# Patient Record
Sex: Male | Born: 1995 | Race: Black or African American | Hispanic: No | Marital: Single | Smoking: Never smoker
Health system: Southern US, Community
[De-identification: ages and names within clinical notes are randomized; demographics above are authoritative.]

## PROBLEM LIST (undated history)

## (undated) HISTORY — PX: TONSILLECTOMY: SUR1361

---
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
## 2016-09-24 ENCOUNTER — Encounter: Payer: Self-pay | Admitting: Family Medicine

## 2016-09-24 ENCOUNTER — Telehealth: Payer: Self-pay | Admitting: Family Medicine

## 2016-09-24 ENCOUNTER — Ambulatory Visit (INDEPENDENT_AMBULATORY_CARE_PROVIDER_SITE_OTHER): Payer: Self-pay | Admitting: Family Medicine

## 2016-09-24 DIAGNOSIS — Z025 Encounter for examination for participation in sport: Secondary | ICD-10-CM

## 2016-09-24 NOTE — Telephone Encounter (Signed)
She can just drop the forms off and we will fill out our portion.  The patients themselves don't have to come back and there's no additional charge for us to fill those out.

## 2016-09-24 NOTE — Progress Notes (Signed)
Patient is a 20 y.o. year old male here for sports physical.  Patient plans to play basketball.  Reports no current complaints.  Denies chest pain, shortness of breath, passing out with exercise.  No medical problems.  No family history of heart disease or sudden death before age 19.   Vision 20/20 each eye without correction Blood pressure normal for age and height  No past medical history on file.  No current outpatient prescriptions on file prior to visit.   No current facility-administered medications on file prior to visit.     Past Surgical History:  Procedure Laterality Date  . TONSILLECTOMY      Allergies  Allergen Reactions  . Augmentin [Amoxicillin-Pot Clavulanate]     rash    Social History   Social History  . Marital status: Single    Spouse name: N/A  . Number of children: N/A  . Years of education: N/A   Occupational History  . Not on file.   Social History Main Topics  . Smoking status: Never Smoker  . Smokeless tobacco: Not on file  . Alcohol use No  . Drug use: Unknown  . Sexual activity: Not on file   Other Topics Concern  . Not on file   Social History Narrative  . No narrative on file    Family History  Problem Relation Age of Onset  . Sudden death Neg Hx   . Heart attack Neg Hx     BP 128/82   Pulse (!) 55   Ht 6\' 2"  (1.88 m)   Wt 170 lb 9.6 oz (77.4 kg)   BMI 21.90 kg/m   Review of Systems: See HPI above.  Physical Exam: Gen: NAD CV: RRR no MRG Lungs: CTAB MSK: FROM and strength all joints and muscle groups.  No evidence scoliosis.  Assessment/Plan: 1. Sports physical: Cleared for all sports without restrictions.

## 2016-09-24 NOTE — Assessment & Plan Note (Signed)
Cleared for all sports without restrictions. 

## 2021-02-25 ENCOUNTER — Other Ambulatory Visit: Payer: Self-pay

## 2021-02-25 ENCOUNTER — Emergency Department (HOSPITAL_BASED_OUTPATIENT_CLINIC_OR_DEPARTMENT_OTHER)
Admission: EM | Admit: 2021-02-25 | Discharge: 2021-02-26 | Disposition: A | Discharge status: Home / Self Care | Payer: BC Managed Care – PPO | Attending: Emergency Medicine | Admitting: Emergency Medicine

## 2021-02-25 ENCOUNTER — Encounter (HOSPITAL_BASED_OUTPATIENT_CLINIC_OR_DEPARTMENT_OTHER): Payer: Self-pay

## 2021-02-25 DIAGNOSIS — F29 Unspecified psychosis not due to a substance or known physiological condition: Secondary | ICD-10-CM | POA: Diagnosis not present

## 2021-02-25 DIAGNOSIS — F19959 Other psychoactive substance use, unspecified with psychoactive substance-induced psychotic disorder, unspecified: Secondary | ICD-10-CM | POA: Diagnosis present

## 2021-02-25 DIAGNOSIS — Z046 Encounter for general psychiatric examination, requested by authority: Secondary | ICD-10-CM | POA: Diagnosis present

## 2021-02-25 DIAGNOSIS — F99 Mental disorder, not otherwise specified: Secondary | ICD-10-CM

## 2021-02-25 DIAGNOSIS — F1210 Cannabis abuse, uncomplicated: Secondary | ICD-10-CM | POA: Diagnosis not present

## 2021-02-25 DIAGNOSIS — Z20822 Contact with and (suspected) exposure to covid-19: Secondary | ICD-10-CM | POA: Insufficient documentation

## 2021-02-25 LAB — COMPREHENSIVE METABOLIC PANEL
ALT: 11 U/L (ref 0–44)
AST: 15 U/L (ref 15–41)
Albumin: 4.8 g/dL (ref 3.5–5.0)
Alkaline Phosphatase: 58 U/L (ref 38–126)
Anion gap: 9 (ref 5–15)
BUN: 18 mg/dL (ref 6–20)
CO2: 27 mmol/L (ref 22–32)
Calcium: 9.1 mg/dL (ref 8.9–10.3)
Chloride: 101 mmol/L (ref 98–111)
Creatinine, Ser: 1.27 mg/dL — ABNORMAL HIGH (ref 0.61–1.24)
GFR, Estimated: >60 mL/min (ref >60)
Glucose, Bld: 84 mg/dL (ref 70–99)
Potassium: 3.7 mmol/L (ref 3.5–5.1)
Sodium: 137 mmol/L (ref 135–145)
Total Bilirubin: 0.6 mg/dL (ref 0.3–1.2)
Total Protein: 7.6 g/dL (ref 6.5–8.1)

## 2021-02-25 LAB — CBC WITH DIFFERENTIAL/PLATELET
Abs Immature Granulocytes: 0 10*3/uL (ref 0.00–0.07)
Basophils Absolute: 0 10*3/uL (ref 0.0–0.1)
Basophils Relative: 0 %
Eosinophils Absolute: 0 10*3/uL (ref 0.0–0.5)
Eosinophils Relative: 0 %
HCT: 42.6 % (ref 39.0–52.0)
Hemoglobin: 14.2 g/dL (ref 13.0–17.0)
Immature Granulocytes: 0 %
Lymphocytes Relative: 41 %
Lymphs Abs: 2.4 10*3/uL (ref 0.7–4.0)
MCH: 30.8 pg (ref 26.0–34.0)
MCHC: 33.3 g/dL (ref 30.0–36.0)
MCV: 92.4 fL (ref 80.0–100.0)
Monocytes Absolute: 0.4 10*3/uL (ref 0.1–1.0)
Monocytes Relative: 8 %
Neutro Abs: 3 10*3/uL (ref 1.7–7.7)
Neutrophils Relative %: 51 %
Platelets: 158 10*3/uL (ref 150–400)
RBC: 4.61 MIL/uL (ref 4.22–5.81)
RDW: 12.4 % (ref 11.5–15.5)
WBC: 5.8 10*3/uL (ref 4.0–10.5)
nRBC: 0 % (ref 0.0–0.2)

## 2021-02-25 LAB — RAPID URINE DRUG SCREEN, HOSP PERFORMED
Amphetamines: NOT DETECTED
Barbiturates: NOT DETECTED
Benzodiazepines: NOT DETECTED
Cocaine: NOT DETECTED
Opiates: NOT DETECTED
Tetrahydrocannabinol: POSITIVE — AB

## 2021-02-25 LAB — RESP PANEL BY RT-PCR (FLU A&B, COVID) ARPGX2
Influenza A by PCR: NEGATIVE
Influenza B by PCR: NEGATIVE
SARS Coronavirus 2 by RT PCR: NEGATIVE

## 2021-02-25 LAB — ETHANOL: Alcohol, Ethyl (B): <10 mg/dL (ref <10)

## 2021-02-25 NOTE — ED Triage Notes (Signed)
Pt presents with HPPD for IVC by family. Pt reports to me that he is here for a "check up". Denies SI/HI.

## 2021-02-25 NOTE — ED Provider Notes (Signed)
[facility] EMERGENCY DEPARTMENT Provider Note   CSN: [id number] Arrival date & time: 02/25/21  1621     History Chief Complaint  Patient presents with  . Psychiatric Evaluation    Ethan Oconnor is a 25 y.o. male with a history of psychosis, cannabis use disorder, presenting under IVC by family for bizarre behavior.  The patient simply reports that he feels fine, has no complaints.  He denies suicidal ideation wanting to harm himself or others.  He denies that he is hearing voices.  He denies seeing anything.  He does not know why he is here.    Supplemental history provided by his mother on the phone.  She took out the IVC.   His mother reports that the patient was committed with a psychotic episode in December 2020.  He was discharged at time of multiple psych medications.  He stopped taking them in March 2021, except for Invega injections, which were scheduled by his psychiatrist.  His last injection was in November December of last year, but then he announced that he no longer wanted them or needed them.  He is therefore been off all of his psychiatric medications.  His mother is reported she has noted a consistent change in his behavior.  She feels he may be erratic and more aggressive at times.  She does report the patient has been going to school, and going to work regularly.  His behavior did abruptly change over this weekend.  She notes that he is smoking marijuana regularly.  She states that at 4 AM this morning, the patient came into the room, wanting to talk to her and her husband about God.  The patient also has to sleep in the bed with him.  He says the patient has a flat affect, simply staring ahead, slow to respond.  This is an abrupt change in his behavior.   HPI     History reviewed. No pertinent past medical history.  Patient Active Problem List   Diagnosis Date Noted  . Sports physical 09/24/2016    Past Surgical History:  Procedure Laterality Date  .  TONSILLECTOMY         Family History  Problem Relation Age of Onset  . Sudden death Neg Hx   . Heart attack Neg Hx     Social History   Tobacco Use  . Smoking status: Never Smoker  . Smokeless tobacco: Never Used  Substance Use Topics  . Alcohol use: Yes  . Drug use: Yes    Types: Marijuana    Home Medications Prior to Admission medications   Medication Sig Start Date End Date Taking? Authorizing Provider  sertraline (ZOLOFT) 100 MG tablet Take by mouth. 11/03/19  Yes [provider]  divalproex (DEPAKOTE ER) 500 MG 24 hr tablet Take 1,000 mg by mouth at bedtime. 09/05/20   [provider]  INVEGA SUSTENNA 156 MG/ML SUSY injection Inject into the muscle. 09/27/20   [provider]    Allergies    Augmentin [amoxicillin-pot clavulanate]  Review of Systems   Review of Systems  Constitutional: Negative for chills and fever.  HENT: Negative for ear pain and sore throat.   Eyes: Negative for pain and visual disturbance.  Respiratory: Negative for cough and shortness of breath.   Cardiovascular: Negative for chest pain and palpitations.  Gastrointestinal: Negative for abdominal pain and vomiting.  Genitourinary: Negative for dysuria and hematuria.  Musculoskeletal: Negative for arthralgias and back pain.  Skin: Negative for color  change and rash.  Neurological: Negative for syncope, light-headedness and headaches.  All other systems reviewed and are negative.   Physical Exam Updated Vital Signs BP (!) 146/97 (BP Location: Right Arm)   Pulse 91   Temp 98.3 F (36.8 C) (Oral)   Resp 18   Ht 6\' 4"  (1.93 m)   Wt 79.2 kg   SpO2 99%   BMI 21.25 kg/m   Physical Exam Constitutional:      General: He is not in acute distress.    Comments: Flat affect, slow to respond  HENT:     Head: Normocephalic and atraumatic.  Eyes:     Conjunctiva/sclera: Conjunctivae normal.     Pupils: Pupils are equal, round, and reactive to light.   Cardiovascular:     Rate and Rhythm: Normal rate and regular rhythm.  Pulmonary:     Effort: Pulmonary effort is normal. No respiratory distress.  Abdominal:     General: There is no distension.     Tenderness: There is no abdominal tenderness.  Skin:    General: Skin is warm and dry.  Neurological:     General: No focal deficit present.     Mental Status: He is alert and oriented to person, place, and time. Mental status is at baseline.     ED Results / Procedures / Treatments   Labs (all labs ordered are listed, but only abnormal results are displayed) Labs Reviewed  COMPREHENSIVE METABOLIC PANEL - Abnormal; Notable for the following components:      Result Value   Creatinine, Ser 1.27 (*)    All other components within normal limits  RAPID URINE DRUG SCREEN, HOSP PERFORMED - Abnormal; Notable for the following components:   Tetrahydrocannabinol POSITIVE (*)    All other components within normal limits  RESP PANEL BY RT-PCR (FLU A&B, COVID) ARPGX2  ETHANOL  CBC WITH DIFFERENTIAL/PLATELET    EKG EKG Interpretation  Date/Time:  Monday February 25 2021 17:44:41 EDT Ventricular Rate:  78 PR Interval:  144 QRS Duration: 86 QT Interval:  382 QTC Calculation: 435 R Axis:   93 Text Interpretation: Normal sinus rhythm Rightward axis Nonspecific ST and T wave abnormality Abnormal ECG Likely benign early repolarization pattern No STEMI Confirmed by 04-25-2002 [phone]) on 02/25/2021 5:50:32 PM   Radiology No results found.  Procedures Procedures   Medications Ordered in ED Medications - No data to display  ED Course  I have reviewed the triage vital signs and the nursing notes.  Pertinent labs & imaging results that were available during my care of the patient were reviewed by me and considered in my medical decision making (see chart for details).  This patient presents to the Emergency Department with complaint of psychiatric disturbance. This involves an extensive  number of treatment options, and is a complaint that carries with it a high risk of complications and morbidity.   I ordered, reviewed, and interpreted labs, including BMP and CBC.  There were no immediate, life-threatening emergencies found in this labwork.  The patient was medically cleared for TTS and psychiatric evaluation.  At this time, the patient is under IVC (filed by parents, upheld by myself on 02/25/21)  Additional history was obtained from patient's mother Previous records obtained and reviewed showing psychiatric episode 10/2019  I personally reviewed the patients ECG which showed sinus rhythm with no acute ischemic findings  *  Labs reviewed & Medically cleared for TTS evaluation    Final Clinical Impression(s) / ED Diagnoses Final  diagnoses:  Psychiatric disturbance  Cannabis abuse    Rx / DC Orders ED Discharge Orders    None       Lakethia Coppess, Kermit Balo, MD 02/25/21 [phone]

## 2021-02-25 NOTE — ED Notes (Signed)
Called staffing  No sitter available

## 2021-02-26 ENCOUNTER — Ambulatory Visit (HOSPITAL_COMMUNITY)
Admission: EM | Admit: 2021-02-26 | Discharge: 2021-02-27 | Disposition: A | Discharge status: Home / Self Care | Payer: BC Managed Care – PPO | Attending: Nurse Practitioner | Admitting: Nurse Practitioner

## 2021-02-26 ENCOUNTER — Other Ambulatory Visit: Payer: Self-pay

## 2021-02-26 ENCOUNTER — Encounter (HOSPITAL_COMMUNITY): Payer: Self-pay | Admitting: Registered Nurse

## 2021-02-26 ENCOUNTER — Encounter (HOSPITAL_BASED_OUTPATIENT_CLINIC_OR_DEPARTMENT_OTHER): Payer: Self-pay | Admitting: Registered Nurse

## 2021-02-26 DIAGNOSIS — F12959 Cannabis use, unspecified with psychotic disorder, unspecified: Secondary | ICD-10-CM | POA: Diagnosis not present

## 2021-02-26 DIAGNOSIS — F1210 Cannabis abuse, uncomplicated: Secondary | ICD-10-CM | POA: Diagnosis not present

## 2021-02-26 DIAGNOSIS — F19959 Other psychoactive substance use, unspecified with psychoactive substance-induced psychotic disorder, unspecified: Secondary | ICD-10-CM | POA: Diagnosis not present

## 2021-02-26 DIAGNOSIS — F99 Mental disorder, not otherwise specified: Secondary | ICD-10-CM

## 2021-02-26 LAB — LIPID PANEL
Cholesterol: 152 mg/dL (ref 0–200)
HDL: 41 mg/dL (ref >40)
LDL Cholesterol: 103 mg/dL — ABNORMAL HIGH (ref 0–99)
Total CHOL/HDL Ratio: 3.7 RATIO
Triglycerides: 41 mg/dL (ref <150)
VLDL: 8 mg/dL (ref 0–40)

## 2021-02-26 LAB — URINALYSIS, ROUTINE W REFLEX MICROSCOPIC
Glucose, UA: NEGATIVE mg/dL
Hgb urine dipstick: NEGATIVE
Ketones, ur: 40 mg/dL — AB
Leukocytes,Ua: NEGATIVE
Nitrite: NEGATIVE
Protein, ur: NEGATIVE mg/dL
Specific Gravity, Urine: 1.025 (ref 1.005–1.030)
pH: 6 (ref 5.0–8.0)

## 2021-02-26 LAB — TSH: TSH: 1.436 u[IU]/mL (ref 0.350–4.500)

## 2021-02-26 MED ORDER — ACETAMINOPHEN 325 MG PO TABS: 650.0000 mg | ORAL_TABLET | Freq: Four times a day (QID) | ORAL | Status: DC | PRN

## 2021-02-26 MED ORDER — HYDROXYZINE HCL 25 MG PO TABS: 25.0000 mg | ORAL_TABLET | Freq: Three times a day (TID) | ORAL | Status: DC | PRN

## 2021-02-26 MED ORDER — TRAZODONE HCL 50 MG PO TABS
50.0000 mg | ORAL_TABLET | Freq: Every evening | ORAL | Status: DC | PRN
  Administered 2021-02-26: 50 mg via ORAL
  Filled 2021-02-26: qty 1

## 2021-02-26 MED ORDER — MAGNESIUM HYDROXIDE 400 MG/5ML PO SUSP: 30.0000 mL | Freq: Every day | ORAL | Status: DC | PRN

## 2021-02-26 MED ORDER — OLANZAPINE 5 MG PO TABS
5.0000 mg | ORAL_TABLET | Freq: Every day | ORAL | Status: DC
  Administered 2021-02-26: 5 mg via ORAL
  Filled 2021-02-26: qty 1

## 2021-02-26 MED ORDER — ALUM & MAG HYDROXIDE-SIMETH 200-200-20 MG/5ML PO SUSP: 30.0000 mL | ORAL | Status: DC | PRN

## 2021-02-26 MED ORDER — OLANZAPINE 5 MG PO TBDP: 5.0000 mg | ORAL_TABLET | Freq: Every day | ORAL | Status: DC

## 2021-02-26 NOTE — ED Notes (Signed)
Pt on screen with TTS.
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
## 2021-02-26 NOTE — Discharge Instructions (Addendum)
Dear Ethan Oconnor,  You presented here after heavy marijuana use and were acting paranoid and was responding to voices.  He received some antipsychotic in the unit. On reevaluation you were doing great and was not seem like psychotic anymore.  You were counseled repeatedly about cessation of marijuana smoking marijuana leading to your second hospitalization.   You promised decreased intake of marijuana and then slowly stop smoking marijuana and also get outpatient help with psychiatry and therapy. I hope you do well and it was pleasure taking care of you! Dr. Gerarda Fraction

## 2021-02-26 NOTE — ED Notes (Signed)
TTS taken to the room.

## 2021-02-26 NOTE — ED Provider Notes (Signed)
Behavioral Health Admission [facility]&P [facility](FBC & OBS)  Date: 02/26/21 Patient Name: Ethan HongDerek Rudzinski MRN: [id number] Chief Complaint: No chief complaint on file.     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  Patient seen by this provider earlier today via tele health.   HPI per Tele health Assessment by this writer 02/26/21:   HPI:  Ethan Oconnor, 25 y.o., male patient seen via tele health by this provider, consulted with Dr. Nelly RoutArchana Kumar; and chart reviewed on 02/26/21.  On evaluation Ethan HongDerek Deanda reports he was brought to the hospital to have a mental evaluation "I need to check my mental health trying to figure everything out."  Patient reporting he was having paranoia earlier today but states he is feeling better now.  Patient states he has diagnosis of schizophrenia and has outpatient psychiatric services with Dr. Jannifer FranklinAkintayo.  Patient reports that he stopped taking his medication about 3 or 4 months ago because he felt he did not need them any longer.  Patient also reports that he smokes marijuana on a daily basis.  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  Patient states he is doing well in all of his classes except for one and that his grade in that class is a D.  Patient states he lives with his parents.  Patient denies suicidal/homicidal ideation, and auditory/visual hallucinations.  Patient reports after stopping his medications he continued to do well until about 2-3 weeks ago.   During evaluation Ethan HongDerek Stroot is sitting up in bed in no acute distress.  He is alert, oriented x 4, calm and cooperative throughout assessment.  His mood is euthymic  but guarded with congruent affect.  He does not appear to be responding to internal/external stimuli or delusional thoughts; but he is guarded and at first denied his drug use until his mother spoke up and then gave truthful answers to questions.  Patient still feels that he doesn't need psychotropic medications.  Patient denies suicidal/self-harm/homicidal ideation, and psychosis.  Patient states he continues to be a little paranoid but it's better.  Patient gave permission to speak to his mother who is at bed side.    Collateral Information:  Spoke to patient mother Juleen Starr(Nicole Poer) at patients bedside Mother states that patient stopped his medications around Dec 2021 and completed his therapy around Jan 2022.  States that he smokes marijuana on a daily basis and sometimes up to twice a day.  Sates that she has notice that he may be smoking more than he usually does.  "We've tried everything to get him to stop.  I don't condone it and we have rules that he doesn't smoke in our house or around us but yesterday I was on a business call.  Me and his father work from home and I was in my home office on a business conference and he walked in my office sat down and lit a blunt in front of me.  He was blowing the smoke towards me.  I tried several times to get him to stop and I final got through the conference call and asked what he thought he was doing but he could speak.  He was pacing back and forth."  Mother states over the last several day she has notice odd behavior, random conversations out of the blue, and isolation.  "I've noticed this once before back around Feb or March 2021 when he has stopped his medications before but we were able to get him back on his medications be for it worsened.  By the end of March he was back on his medications and improved.  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     Patient seen face to face by this provider, consulted with Dr. Bronwen Betters; and chart reviewed on 02/26/21.  On evaluation there is no change in Ethan Oconnor complaints.  Patient appears to be doing better but continues to be guarded with paranoia.  Patient admitted to Continuous Assessment Unit for safety and stabilization  PHQ 2-9:  Flowsheet Row ED from 02/25/2021 in [facility] [place] [facility]  Thoughts that you would be better off dead, or of hurting yourself in some way Not at all  PHQ-9 Total Score 1      Flowsheet Row ED from 02/26/2021 in [facility] ED from 02/25/2021 in [facility] [facility]  C-SSRS RISK CATEGORY No Risk No Risk       Total Time spent with patient: 30 minutes  Musculoskeletal  Strength & Muscle Tone: within normal limits Gait & Station: normal Patient leans: N/A  Psychiatric Specialty Exam  Presentation General Appearance: Appropriate for Environment; Casual  Eye Contact:Good  Speech:Clear and Coherent; Normal Rate  Speech Volume:Normal  Handedness:Right   Mood and Affect  Mood:Anxious  Affect:Congruent   Thought Process  Thought Processes:Coherent; Goal Directed  Descriptions of Associations:Intact  Orientation:Full (Time, Place and Person)  Thought Content:Paranoid Ideation  Diagnosis of Schizophrenia or Schizoaffective disorder in past: Yes  Duration of Psychotic Symptoms: Less than six months  Hallucinations:Hallucinations: None  Ideas of Reference:None  Suicidal Thoughts:Suicidal Thoughts: No  Homicidal Thoughts:Homicidal Thoughts: No   Sensorium  Memory:Immediate Fair; Recent Fair  Judgment:Fair  Insight:Present   Executive Functions  Concentration:Fair  Attention  Span:Fair  Recall:Fair  Fund of Knowledge:Good  Language:Good   Psychomotor Activity  Psychomotor Activity:Psychomotor Activity: Normal   Assets  Assets:Communication Skills; Desire for Improvement; Housing; Resilience; Social Support   Sleep  Sleep:Sleep: Fair   Nutritional Assessment (For OBS and FBC admissions only) Has the patient had a weight loss or gain of 10 pounds or more in the last 3 months?: No Has the patient had a decrease in food intake/or appetite?: No Does the patient have dental problems?: No Does the patient have eating habits or behaviors that may be indicators of an eating disorder including binging or inducing vomiting?: No Has the patient recently lost weight without trying?: No Has the patient been eating poorly because of a decreased appetite?: No Malnutrition Screening Tool Score: 0    Physical Exam ROS  Blood pressure (!) 154/96, pulse 68, temperature 98.1 F (36.7 C), temperature source Oral, resp. rate 16, SpO2 100 %. There is no height or weight on file to calculate BMI.  Past Psychiatric History: Brief psychotic disorder, Schizophrenia, substance induced psychosis, and Cannabis use disorder   Is the patient at  risk to self? No  Has the patient been a risk to self in the past 6 months? No .    Has the patient been a risk to self within the distant past? No   Is the patient a risk to others? No   Has the patient been a risk to others in the past 6 months? No   Has the patient been a risk to others within the distant past? No   Past Medical History: History reviewed. No pertinent past medical history.  Past Surgical History:  Procedure Laterality Date  . TONSILLECTOMY      Family History:  Family History  Problem Relation Age of Onset  . Sudden death Neg Hx   . Heart attack Neg Hx     Social History:  Social History   Socioeconomic History  . Marital status: Single    Spouse name: Not on file  . Number of children: Not on  file  . Years of education: Not on file  . Highest education level: Not on file  Occupational History  . Not on file  Tobacco Use  . Smoking status: Never Smoker  . Smokeless tobacco: Never Used  Substance and Sexual Activity  . Alcohol use: Yes  . Drug use: Yes    Types: Marijuana  . Sexual activity: Not on file  Other Topics Concern  . Not on file  Social History Narrative  . Not on file   Social Determinants of Health   Financial Resource Strain: Not on file  Food Insecurity: Not on file  Transportation Needs: Not on file  Physical Activity: Not on file  Stress: Not on file  Social Connections: Not on file  Intimate Partner Violence: Not on file    SDOH:  SDOH Screenings   Alcohol Screen: Not on file  Depression (PHQ2-9): Low Risk   . PHQ-2 Score: 1  Financial Resource Strain: Not on file  Food Insecurity: Not on file  Housing: Not on file  Physical Activity: Not on file  Social Connections: Not on file  Stress: Not on file  Tobacco Use: Low Risk   . Smoking Tobacco Use: Never Smoker  . Smokeless Tobacco Use: Never Used  Transportation Needs: Not on file    Last Labs:  Admission on 02/25/2021, Discharged on 02/26/2021  Component Date Value Ref Range Status  . SARS Coronavirus 2 by RT PCR 02/25/2021 NEGATIVE  NEGATIVE Final   Comment: (NOTE) SARS-CoV-2 target nucleic acids are NOT DETECTED.  The SARS-CoV-2 RNA is generally detectable in upper respiratory specimens during the acute phase of infection. The lowest concentration of SARS-CoV-2 viral copies this assay can detect is 138 copies/mL. A negative result does not preclude SARS-Cov-2 infection and should not be used as the sole basis for treatment or other patient management decisions. A negative result may occur with  improper specimen collection/handling, submission of specimen other than nasopharyngeal swab, presence of viral mutation(s) within the areas targeted by this assay, and inadequate  number of viral copies(<138 copies/mL). A negative result must be combined with clinical observations, patient history, and epidemiological information. The expected result is Negative.  Fact Sheet for Patients:  BloggerCourse.com  Fact Sheet for Healthcare Providers:  SeriousBroker.it  This test is no                          t yet approved or cleared by the [place] FDA and  has been authorized for detection and/or  diagnosis of SARS-CoV-2 by FDA under an Emergency Use Authorization (EUA). This EUA will remain  in effect (meaning this test can be used) for the duration of the COVID-19 declaration under Section 564(b)(1) of the Act, 21 U.S.C.section 360bbb-3(b)(1), unless the authorization is terminated  or revoked sooner.      . Influenza A by PCR 02/25/2021 NEGATIVE  NEGATIVE Final  . Influenza B by PCR 02/25/2021 NEGATIVE  NEGATIVE Final   Comment: (NOTE) The Xpert Xpress SARS-CoV-2/FLU/RSV plus assay is intended as an aid in the diagnosis of influenza from Nasopharyngeal swab specimens and should not be used as a sole basis for treatment. Nasal washings and aspirates are unacceptable for Xpert Xpress SARS-CoV-2/FLU/RSV testing.  Fact Sheet for Patients: BloggerCourse.com  Fact Sheet for Healthcare Providers: SeriousBroker.it  This test is not yet approved or cleared by the [place] FDA and has been authorized for detection and/or diagnosis of SARS-CoV-2 by FDA under an Emergency Use Authorization (EUA). This EUA will remain in effect (meaning this test can be used) for the duration of the COVID-19 declaration under Section 564(b)(1) of the Act, 21 U.S.C. section 360bbb-3(b)(1), unless the authorization is terminated or revoked.  Performed at [facility], [address] [place], [place] [address]   . Sodium 02/25/2021 137  135 - 145 mmol/L Final   . Potassium 02/25/2021 3.7  3.5 - 5.1 mmol/L Final  . Chloride 02/25/2021 101  98 - 111 mmol/L Final  . CO2 02/25/2021 27  22 - 32 mmol/L Final  . Glucose, Bld 02/25/2021 84  70 - 99 mg/dL Final   Glucose reference range applies only to samples taken after fasting for at least 8 hours.  . BUN 02/25/2021 18  6 - 20 mg/dL Final  . Creatinine, Ser 02/25/2021 1.27* 0.61 - 1.24 mg/dL Final  . Calcium 78/58/8502 9.1  8.9 - 10.3 mg/dL Final  . Total Protein 02/25/2021 7.6  6.5 - 8.1 g/dL Final  . Albumin 77/41/2878 4.8  3.5 - 5.0 g/dL Final  . AST 67/67/2094 15  15 - 41 U/L Final  . ALT 02/25/2021 11  0 - 44 U/L Final  . Alkaline Phosphatase 02/25/2021 58  38 - 126 U/L Final  . Total Bilirubin 02/25/2021 0.6  0.3 - 1.2 mg/dL Final  . GFR, Estimated 02/25/2021 >60  >60 mL/min Final   Comment: (NOTE) Calculated using the CKD-EPI Creatinine Equation (2021)   . Anion gap 02/25/2021 9  5 - 15 Final   Performed at [facility], [address] [place], [place] [address]  . Alcohol, Ethyl (B) 02/25/2021 <10  <10 mg/dL Final   Comment: (NOTE) Lowest detectable limit for serum alcohol is 10 mg/dL.  For medical purposes only. Performed at [facility], [address] [place], [place] [address]   . Opiates 02/25/2021 NONE DETECTED  NONE DETECTED Final  . Cocaine 02/25/2021 NONE DETECTED  NONE DETECTED Final  . Benzodiazepines 02/25/2021 NONE DETECTED  NONE DETECTED Final  . Amphetamines 02/25/2021 NONE DETECTED  NONE DETECTED Final  . Tetrahydrocannabinol 02/25/2021 POSITIVE* NONE DETECTED Final  . Barbiturates 02/25/2021 NONE DETECTED  NONE DETECTED Final   Comment: (NOTE) DRUG SCREEN FOR MEDICAL PURPOSES ONLY.  IF CONFIRMATION IS NEEDED FOR ANY PURPOSE, NOTIFY LAB WITHIN 5 DAYS.  LOWEST DETECTABLE LIMITS FOR URINE DRUG SCREEN Drug Class                     Cutoff (ng/mL)  Amphetamine and metabolites    1000 Barbiturate and metabolites    200 Benzodiazepine                  200 Tricyclics and metabolites     300 Opiates and metabolites        300 Cocaine and metabolites        300 THC                            50 Performed at [facility], [address] [place], [place] [address]   . WBC 02/25/2021 5.8  4.0 - 10.5 K/uL Final  . RBC 02/25/2021 4.61  4.22 - 5.81 MIL/uL Final  . Hemoglobin 02/25/2021 14.2  13.0 - 17.0 g/dL Final  . HCT 56/43/3295 42.6  39.0 - 52.0 % Final  . MCV 02/25/2021 92.4  80.0 - 100.0 fL Final  . MCH 02/25/2021 30.8  26.0 - 34.0 pg Final  . MCHC 02/25/2021 33.3  30.0 - 36.0 g/dL Final  . RDW 18/84/1660 12.4  11.5 - 15.5 % Final  . Platelets 02/25/2021 158  150 - 400 K/uL Final  . nRBC 02/25/2021 0.0  0.0 - 0.2 % Final  . Neutrophils Relative % 02/25/2021 51  % Final  . Neutro Abs 02/25/2021 3.0  1.7 - 7.7 K/uL Final  . Lymphocytes Relative 02/25/2021 41  % Final  . Lymphs Abs 02/25/2021 2.4  0.7 - 4.0 K/uL Final  . Monocytes Relative 02/25/2021 8  % Final  . Monocytes Absolute 02/25/2021 0.4  0.1 - 1.0 K/uL Final  . Eosinophils Relative 02/25/2021 0  % Final  . Eosinophils Absolute 02/25/2021 0.0  0.0 - 0.5 K/uL Final  . Basophils Relative 02/25/2021 0  % Final  . Basophils Absolute 02/25/2021 0.0  0.0 - 0.1 K/uL Final  . Immature Granulocytes 02/25/2021 0  % Final  . Abs Immature Granulocytes 02/25/2021 0.00  0.00 - 0.07 K/uL Final   Performed at [facility], [address] [place], [place] [address]  . Color, Urine 02/25/2021 YELLOW  YELLOW Final  . APPearance 02/25/2021 CLOUDY* CLEAR Final  . Specific Gravity, Urine 02/25/2021 1.025  1.005 - 1.030 Final  . pH 02/25/2021 6.0  5.0 - 8.0 Final  . Glucose, UA 02/25/2021 NEGATIVE  NEGATIVE mg/dL Final  . Hgb urine dipstick 02/25/2021 NEGATIVE  NEGATIVE Final  . Bilirubin Urine 02/25/2021 SMALL* NEGATIVE Final  . Ketones, ur 02/25/2021 40* NEGATIVE mg/dL Final  . Protein, ur 11/25/3233 NEGATIVE  NEGATIVE mg/dL Final  . Nitrite 57/32/2025  NEGATIVE  NEGATIVE Final  . Glori Luis 02/25/2021 NEGATIVE  NEGATIVE Final   Comment: Microscopic not done on urines with negative protein, blood, leukocytes, nitrite, or glucose < 500 mg/dL. Performed at [facility], [address] [place], [place] [address]   . TSH 02/26/2021 1.436  0.350 - 4.500 uIU/mL Final   Comment: Performed by a 3rd Generation assay with a functional sensitivity of <=0.01 uIU/mL. Performed at [facility], [address] [place], [place] [address]   . Cholesterol 02/26/2021 152  0 - 200 mg/dL Final  . Triglycerides 02/26/2021 41  <150 mg/dL Final  . HDL 83/15/1761 41  >40 mg/dL Final  . Total CHOL/HDL Ratio 02/26/2021 3.7  RATIO Final  . VLDL 02/26/2021 8  0 - 40 mg/dL Final  . LDL Cholesterol 02/26/2021 103* 0 - 99 mg/dL Final  Comment:        Total Cholesterol/HDL:CHD Risk Coronary Heart Disease Risk Table                     Men   Women  1/2 Average Risk   3.4   3.3  Average Risk       5.0   4.4  2 X Average Risk   9.6   7.1  3 X Average Risk  23.4   11.0        Use the calculated Patient Ratio above and the CHD Risk Table to determine the patient's CHD Risk.        ATP III CLASSIFICATION (LDL):  <100     mg/dL   Optimal  161-096  mg/dL   Near or Above                    Optimal  130-159  mg/dL   Borderline  045-409  mg/dL   High  >811     mg/dL   Very High Performed at [facility], [address], [place], [place] [address]     Allergies: Augmentin [amoxicillin-pot clavulanate]  PTA Medications: (Not in a hospital admission)   Medical Decision Making  Patient admitted to Continuous Assessment Unit for safety and stabilization  Labs reviewed:  See values above  Medication Management: Started Zyprexa Zydis 5 mg Q hs  Recommendations  Based on my evaluation the patient does not appear to have an emergency medical condition.  Irina Okelly, NP 02/26/21  6:46 PM

## 2021-02-26 NOTE — ED Notes (Signed)
Pt belongings in locker #17. °

## 2021-02-26 NOTE — ED Notes (Signed)
Pt asleep in bed. Respirations even and unlabored. Will continue to monitor for safety. ?

## 2021-02-26 NOTE — ED Notes (Signed)
Pt A& O x 4, ambulatory on unit, no distress noted.  Calm & guarded at present.  Monitoring for safety.

## 2021-02-26 NOTE — BH Assessment (Addendum)
Comprehensive Clinical Assessment (CCA) Note  02/26/2021 Ethan Oconnor [id number]   Recommendations for Services/Supports/Treatments: Nira Conn, NP, reviewed pt's chart and information and determined pt should be observed overnight at [facility] for safety and stability and re-assessed in the morning by psychiatry. This information was provided to pt's nurse, Shaun RN, at [phone].   The patient demonstrates the following risk factors for suicide: Chronic risk factors for suicide include: psychiatric disorder of psychosis and anxiety and demographic factors (male, >86 y/o). Acute risk factors for suicide include: social withdrawal/isolation. Protective factors for this patient include: positive social support and hope for the future. Considering these factors, the overall suicide risk at this point appears to be none. Patient is appropriate for outpatient follow up.  Therefore, no sitter is recommended for suicide precaution.  Flowsheet Row ED from 02/25/2021 in [facility] EMERGENCY DEPARTMENT  C-SSRS RISK CATEGORY No Risk      Chief Complaint:  Chief Complaint  Patient presents with  . Psychiatric Evaluation   Visit Diagnosis: F29, Unspecified schizophrenia spectrum and other psychotic disorder  CCA Screening, Triage and Referral (STR) Kyro Joswick is a 25 year old patient who was involuntarily brought to [facility] ED under IVC that was completed by his mother. The IVC states:  "Last committed October 29, 2019. Schneur was up at 4am wanting to talk about God and yesterday's sermon. He says he had not slept yet. I laid down with him and he fell asleep at about 5:15. His conversation was random and was crying at one point. He went to class this morning but his behavior was strange again, talking about random things. He smoked some weed last night about 11:30 and said he had not smoked when he went to class. He came and sat down with me while I was working and on a call and he  started to smoke in front of me and wouldn't talk. Then he kept pacing back and forth and admitted to having an internal conversation. Cried more."  Of note, the IVC was hand-written and was difficult to decipher at times.  When asked why he was brought to the hospital, pt states he is, "Getting a check-up for my mental health." Pt denies current SI, though he acknowledges a hx of SI approximately 1 year ago. He shares he was hospitalized for mental health concerns at that time. He denies he currently has a plan to kill himself.  Pt denies HI, AVH, NSSIB (though he acknowledges he had thoughts of engaging in NSSIB but had no plan), or engagement with the legal system. Pt shares his father keeps guns in the home and that they are not locked/in a safe. Pt states he engages in the use of marijuana 3-4 times/week. He states he smokes approximately 1 gram/day and that he last smoked 02/25/2021.  Pt shares he is graduating with his Associate's Degree in May 2022. He shares he has been working at [employer] for approximately 1 year and that he enjoys his job. Pt denies he is taking medication or that he has ever been on medication, which conflicts what his mother told pt's EDP. Per pt's EDP note, "His mother reports that the patient was committed with a psychotic episode in December 2020.  He was discharged at time of multiple psych medications.  He stopped taking them in March 2021, except for Invega injections, which were scheduled by his psychiatrist.  His last injection was in November December of last year, but then he announced that he  no longer wanted them or needed them.  He is therefore been off all of his psychiatric medications.  His mother is reported she has noted a consistent change in his behavior.  She feels he may be erratic and more aggressive at times.  She does report the patient has been going to school, and going to work regularly.  His behavior did abruptly change over this weekend.  She  notes that he is smoking marijuana regularly.  She states that at 4 AM this morning, the patient came into the room, wanting to talk to her and her husband about God.  The patient also has to sleep in the bed with him.  He says the patient has a flat affect, simply staring ahead, slow to respond.  This is an abrupt change in his behavior."  Pt is oriented x5. His recent/remote memory is intact. Pt was quiet, and somewhat guarded, throughout the assessment process, though pt was able to answer the questions posed and interacted appropriately. Pt's insight, judgement, and impulse control is fair at this time.  Makayla Confer, mother/petitioner: [phone]   Patient Reported Information How did you hear about [place]? Other (Comment) (IVC paperwork was filed on pt)  Referral name: Pt had IVC paperwork filed  Referral phone number: 0 (N/A)   Whom do you see for routine medical problems? I don't have a doctor  Practice/Facility Name: No data recorded Practice/Facility Phone Number: No data recorded Name of Contact: No data recorded Contact Number: No data recorded Contact Fax Number: No data recorded Prescriber Name: No data recorded Prescriber Address (if known): No data recorded  What Is the Reason for Your Visit/Call Today? Pt states he was brought to the ED for a check-up for his mental health.  How Long Has This Been Causing You Problems? > than 6 months  What Do You Feel Would Help You the Most Today? -- (Pt denies needing assistance at this time)   Have You Recently Been in Any Inpatient Treatment (Hospital/Detox/Crisis Center/28-Day Program)? No  Name/Location of Program/Hospital:No data recorded How Long Were You There? No data recorded When Were You Discharged? No data recorded  Have You Ever Received Services From [facility] Before? No  Who Do You See at [facility]? No data recorded  Have You Recently Had Any Thoughts About Hurting Yourself? No  Are You Planning to  Commit Suicide/Harm Yourself At This time? No   Have you Recently Had Thoughts About Hurting Someone Karolee Ohs? No  Explanation: No data recorded  Have You Used Any Alcohol or Drugs in the Past 24 Hours? Yes  How Long Ago Did You Use Drugs or Alcohol? No data recorded What Did You Use and How Much? Marijuana; approx 1 gram   Do You Currently Have a Therapist/Psychiatrist? No  Name of Therapist/Psychiatrist: No data recorded  Have You Been Recently Discharged From Any Office Practice or Programs? No  Explanation of Discharge From Practice/Program: No data recorded    CCA Screening Triage Referral Assessment Type of Contact: Tele-Assessment  Is this Initial or Reassessment? Initial Assessment  Date Telepsych consult ordered in [facility]:  02/25/2021  Time Telepsych consult ordered in [facility]:  [id number]   Patient Reported Information Reviewed? Yes  Patient Left Without Being Seen? No data recorded Reason for Not Completing Assessment: No data recorded  Collateral Involvement: EDP note w/ collateral from pt's mother   Does Patient Have a Court Appointed Legal Guardian? No data recorded Name and Contact of Legal Guardian: No data  recorded If Minor and Not Living with Parent(s), Who has Custody? N/A  Is CPS involved or ever been involved? Never  Is APS involved or ever been involved? Never   Patient Determined To Be At Risk for Harm To Self or Others Based on Review of Patient Reported Information or Presenting Complaint? No  Method: No data recorded Availability of Means: No data recorded Intent: No data recorded Notification Required: No data recorded Additional Information for Danger to Others Potential: No data recorded Additional Comments for Danger to Others Potential: No data recorded Are There Guns or Other Weapons in Your Home? No data recorded Types of Guns/Weapons: No data recorded Are These Weapons Safely Secured?                            No data recorded Who Could  Verify You Are Able To Have These Secured: No data recorded Do You Have any Outstanding Charges, Pending Court Dates, Parole/Probation? No data recorded Contacted To Inform of Risk of Harm To Self or Others: Other: Comment (N/A)   Location of Assessment: [facility]   Does Patient Present under Involuntary Commitment? Yes  IVC Papers Initial File Date: 02/25/2021   [place]   Patient Currently Receiving the Following Services: Not Receiving Services   Determination of Need: Urgent (48 hours)   Options For Referral: Other: Comment (Overnight observation)     CCA Biopsychosocial Intake/Chief Complaint:  Pt states he was brought to the ED for a check-up for his mental health.  Current Symptoms/Problems: None noted   Patient Reported Schizophrenia/Schizoaffective Diagnosis in Past: Yes   Strengths: Not assessed  Preferences: Not assessed  Abilities: Not assessed   Type of Services Patient Feels are Needed: Not assessed   Initial Clinical Notes/Concerns: None noted   Mental Health Symptoms Depression:  None   Duration of Depressive symptoms: No data recorded  Mania:  None   Anxiety:   Worrying   Psychosis:  Hallucinations   Duration of Psychotic symptoms: Less than six months   Trauma:  None   Obsessions:  None   Compulsions:  None   Inattention:  None   Hyperactivity/Impulsivity:  N/A   Oppositional/Defiant Behaviors:  None   Emotional Irregularity:  Mood lability   Other Mood/Personality Symptoms:  None noted    Mental Status Exam Appearance and self-care  Stature:  Average   Weight:  Average weight   Clothing:  No data recorded  Grooming:  Normal   Cosmetic use:  None   Posture/gait:  Normal   Motor activity:  Not Remarkable   Sensorium  Attention:  Normal   Concentration:  Normal   Orientation:  X5   Recall/memory:  Normal   Affect and Mood  Affect:  Anxious   Mood:  Anxious    Relating  Eye contact:  Normal   Facial expression:  Responsive   Attitude toward examiner:  Guarded; Cooperative   Thought and Language  Speech flow: Clear and Coherent   Thought content:  Appropriate to Mood and Circumstances   Preoccupation:  None   Hallucinations:  None   Organization:  No data recorded  Affiliated Computer ServicesExecutive Functions  Fund of Knowledge:  Average   Intelligence:  Average   Abstraction:  Functional   Judgement:  Fair   Reality Testing:  Adequate   Insight:  Fair   Decision Making:  Impulsive   Social Functioning  Social Maturity:  Isolates  Social Judgement:  Naive   Stress  Stressors:  Illness   Coping Ability:  Contractor Deficits:  Self-control   Supports:  Family; Friends/Service system     Religion: Religion/Spirituality Are You A Religious Person?:  (Not assessed) How Might This Affect Treatment?: Not assessed  Leisure/Recreation: Leisure / Recreation Do You Have Hobbies?:  (Not assessed)  Exercise/Diet: Exercise/Diet Do You Exercise?:  (Not assessed) Have You Gained or Lost A Significant Amount of Weight in the Past Six Months?:  (Not assessed) Do You Follow a Special Diet?:  (Not assessed) Do You Have Any Trouble Sleeping?:  (Not assessed)   CCA Employment/Education Employment/Work Situation: Employment / Work Situation Employment situation: Employed Where is patient currently employed?: [employer] long has patient been employed?: 1 year Patient's job has been impacted by current illness: No What is the longest time patient has a held a job?: Not assessed Where was the patient employed at that time?: Not assessed Has patient ever been in the [employer]?:  (Not assessed)  Education: Education Is Patient Currently Attending School?: Yes School Currently Attending: [employer] Last Grade Completed:  (Pt is in his sophomore year) Name of High School: [employer] Did Garment/textile technologist From [employer]?:  Yes Did You Attend College?: Yes What Type of College Degree Do you Have?: Pt will graduate with his associate's in May 2022 Did You Attend Graduate School?:  (Not assessed) What Was Your Major?: Not assessed Did You Have Any Special Interests In School?: Not assessed Did You Have An Individualized Education Program (IIEP):  (Not assessed) Did You Have Any Difficulty At School?:  (Not assessed) Patient's Education Has Been Impacted by Current Illness:  (Not assessed)   CCA Family/Childhood History Family and Relationship History: Family history Marital status: Single Are you sexually active?:  (Not assessed) What is your sexual orientation?: Not assessed Has your sexual activity been affected by drugs, alcohol, medication, or emotional stress?: Not assessed Does patient have children?: No  Childhood History:  Childhood History By whom was/is the patient raised?: Both parents Additional childhood history information: Not assessed Description of patient's relationship with caregiver when they were a child: Not assessed Patient's description of current relationship with people who raised him/her: Not assessed How were you disciplined when you got in trouble as a child/adolescent?: Not assessed Does patient have siblings?: Yes Number of Siblings: 1 Description of patient's current relationship with siblings: Pt shares he has a good relationship with his 23 year old sister and that he's there for her. Did patient suffer any verbal/emotional/physical/sexual abuse as a child?: No Did patient suffer from severe childhood neglect?: No Has patient ever been sexually abused/assaulted/raped as an adolescent or adult?: No Was the patient ever a victim of a crime or a disaster?: No Witnessed domestic violence?: No Has patient been affected by domestic violence as an adult?: No  Child/Adolescent Assessment:     CCA Substance Use Alcohol/Drug Use: Alcohol / Drug Use Pain Medications:  Please see MAR Prescriptions: Please see MAR Over the Counter: Please see MAR History of alcohol / drug use?: Yes Longest period of sobriety (when/how long): Unknown Negative Consequences of Use:  (Unknown) Withdrawal Symptoms: Other (Comment) (None noted) Substance #1 Name of Substance 1: Marijuana 1 - Age of First Use: 9th grade 1 - Amount (size/oz): 1 gram 1 - Frequency: 3-4 times/week 1 - Duration: Unknown 1 - Last Use / Amount: 02/25/2021 1 - Method of Aquiring: Purchase 1- Route of Use: Smoke  ASAM's:  Six Dimensions of Multidimensional Assessment  Dimension 1:  Acute Intoxication and/or Withdrawal Potential:   Dimension 1:  Description of individual's past and current experiences of substance use and withdrawal: No w/d experiences in the past  Dimension 2:  Biomedical Conditions and Complications:   Dimension 2:  Description of patient's biomedical conditions and  complications: Pt lists no medical concerns  Dimension 3:  Emotional, Behavioral, or Cognitive Conditions and Complications:  Dimension 3:  Description of emotional, behavioral, or cognitive conditions and complications: Pt has stopped taking his medication and is instead smoking marijuana  Dimension 4:  Readiness to Change:  Dimension 4:  Description of Readiness to Change criteria: Pt does not feel a need to stop smoking marijuana  Dimension 5:  Relapse, Continued use, or Continued Problem Potential:  Dimension 5:  Relapse, continued use, or continued problem potential critiera description: Pt has no desire to quit using marijuana at this time.  Dimension 6:  Recovery/Living Environment:  Dimension 6:  Recovery/Iiving environment criteria description: Pt lives with his parents who are supportive of his mental health.  ASAM Severity Score: ASAM's Severity Rating Score: 8  ASAM Recommended Level of Treatment: ASAM Recommended Level of Treatment: Level I Outpatient Treatment   Substance use  Disorder (SUD) Substance Use Disorder (SUD)  Checklist Symptoms of Substance Use:  (None noted)  Recommendations for Services/Supports/Treatments: Recommendations for Services/Supports/Treatments Recommendations For Services/Supports/Treatments: Individual Therapy,Medication Management  Nira Conn, NP, reviewed pt's chart and information and determined pt should be observed overnight at [facility] for safety and stability and re-assessed in the morning by psychiatry. This information was provided to pt's nurse, Shaun RN, at [phone].  DSM5 Diagnoses: Patient Active Problem List   Diagnosis Date Noted  . Sports physical 09/24/2016    Patient Centered Plan: Patient is on the following Treatment Plan(s):  Anxiety   Referrals to Alternative Service(s): Referred to Alternative Service(s):   Place:   Date:   Time:    Referred to Alternative Service(s):   Place:   Date:   Time:    Referred to Alternative Service(s):   Place:   Date:   Time:    Referred to Alternative Service(s):   Place:   Date:   Time:     Ralph Dowdy, LMFT

## 2021-02-26 NOTE — ED Provider Notes (Signed)
Pt has been accepted for transfer to [facility].   Rolan Bucco, MD 02/26/21 1210

## 2021-02-26 NOTE — ED Notes (Addendum)
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 Denies SI/HI/AVH. Pt states using marijuana daily. Oriented to unit and unit rules. Informed pt to notify staff with any needs or concerns. Safety maintained.

## 2021-02-26 NOTE — Consult Note (Signed)
Telepsych Consultation   Reason for Consult:  Psychosis Referring Physician:  Rolan Bucco, MD Location of Patient: [facility] Location of Provider: Other: [facility]  Patient Identification: Ethan Oconnor MRN:  [id number] Principal Diagnosis: Psychoactive substance-induced psychosis (HCC) Diagnosis:  Principal Problem:   Psychoactive substance-induced psychosis (HCC)   Total Time spent with patient: 20 minutes  Subjective:   Ethan Oconnor is a 25 y.o. male patient admitted to [facility] under IVC petition by his mother with complaints of paranoic, not sleeping, restlessness.  HPI:  Ethan Oconnor, 25 y.o., male patient seen via tele health by this provider, consulted with Dr. Nelly Rout; and chart reviewed on 02/26/21.  On evaluation Ethan Oconnor reports he was brought to the hospital to have a mental evaluation "I need to check my mental health trying to figure everything out."  Patient reporting he was having paranoia earlier today but states he is feeling better now.  Patient states he has diagnosis of schizophrenia and has outpatient psychiatric services with Dr. Jannifer Franklin.  Patient reports that he stopped taking his medication about 3 or 4 months ago because he felt he did not need them any longer.  Patient also reports that he smokes marijuana on a daily basis.  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  Patient states he is doing well in all of his classes except for one and that his grade in that class is a D.  Patient states he lives with his parents.  Patient denies suicidal/homicidal ideation, and auditory/visual hallucinations.  Patient reports after stopping his medications he continued to do well until about 2-3 weeks ago.   During evaluation Ethan Oconnor is sitting up in bed in no acute distress.  He is alert, oriented x 4, calm and  cooperative throughout assessment.  His mood is euthymic but guarded with congruent affect.  He does not appear to be responding to internal/external stimuli or delusional thoughts; but he is guarded and at first denied his drug use until his mother spoke up and then gave truthful answers to questions.  Patient still feels that he doesn't need psychotropic medications.  Patient denies suicidal/self-harm/homicidal ideation, and psychosis.  Patient states he continues to be a little paranoid but it's better.  Patient gave permission to speak to his mother who is at bed side.   Collateral Information:  Spoke to patient mother Digby Groeneveld) at patients bedside Mother states that patient stopped his medications around Dec 2021 and completed his therapy around Jan 2022.  States that he smokes marijuana on a daily basis and sometimes up to twice a day.  Sates that she has notice that he may be smoking more than he usually does.  "We've tried everything to get him to stop.  I don't condone it and we have rules that he doesn't smoke in our house or around [place] but yesterday I was on a business call.  Me and his father work from home and I was in my home office on a business conference and he walked in my office sat down and lit a blunt in front of me.  He was blowing the smoke towards me.  I tried several times to get him to stop and I final got through the conference call and asked what he thought he was doing but he could speak.  He was  pacing back and forth."  Mother states over the last several day she has notice odd behavior, random conversations out of the blue, and isolation.  "I've noticed this once before back around Feb or March 2021 when he has stopped his medications before but we were able to get him back on his medications be for it worsened.  By the end of March he was back on his medications and improved.  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     Past Psychiatric History: Brief psychotic disorder, Schizophrenia, substance induced psychosis, and Cannabis use disorder  Risk to Self:  Denies Risk to Others:  Denies Prior Inpatient Therapy:  Yes Prior Outpatient Therapy:  Yes  Past Medical History: History reviewed. No pertinent past medical history.  Past Surgical History:  Procedure Laterality Date  . TONSILLECTOMY     Family History:  Family History  Problem Relation Age of Onset  . Sudden death Neg Hx   . Heart attack Neg Hx    Family Psychiatric  History: Sister/Bipolar disorder  Social History:  Social History   Substance and Sexual Activity  Alcohol Use Yes     Social History   Substance and Sexual Activity  Drug Use Yes  . Types: Marijuana    Social History   Socioeconomic History  . Marital status: Single    Spouse name: Not on file  . Number of children: Not on file  . Years of education: Not on file  . Highest education level: Not on file  Occupational History  . Not on file  Tobacco Use  . Smoking status: Never Smoker  . Smokeless tobacco: Never Used  Substance and Sexual Activity  . Alcohol use: Yes  . Drug use: Yes    Types: Marijuana  . Sexual activity: Not on file  Other Topics Concern  . Not on file  Social History Narrative  . Not on file   Social Determinants of Health   Financial Resource Strain: Not on file  Food Insecurity: Not on file  Transportation Needs: Not on file  Physical Activity: Not on file  Stress: Not on file  Social Connections: Not on file   Additional Social History:    Allergies:   Allergies  Allergen Reactions  . Augmentin [Amoxicillin-Pot Clavulanate]     rash    Labs:  Results for orders placed or  performed during the hospital encounter of 02/25/21 (from the past 48 hour(s))  Comprehensive metabolic panel     Status: Abnormal   Collection Time: 02/25/21  5:44 PM  Result Value Ref Range   Sodium 137 135 - 145 mmol/L   Potassium 3.7 3.5 - 5.1 mmol/L   Chloride 101 98 - 111 mmol/L   CO2 27 22 - 32 mmol/L   Glucose, Bld 84 70 - 99 mg/dL    Comment: Glucose reference range applies only to samples taken after fasting for at least 8 hours.   BUN 18 6 - 20 mg/dL   Creatinine, Ser 1.611.27 (H) 0.61 - 1.24 mg/dL   Calcium 9.1 8.9 - 09.610.3 mg/dL   Total Protein 7.6 6.5 - 8.1 g/dL   Albumin 4.8 3.5 - 5.0 g/dL   AST  15 15 - 41 U/L   ALT 11 0 - 44 U/L   Alkaline Phosphatase 58 38 - 126 U/L   Total Bilirubin 0.6 0.3 - 1.2 mg/dL   GFR, Estimated >29 >51 mL/min    Comment: (NOTE) Calculated using the CKD-EPI Creatinine Equation (2021)    Anion gap 9 5 - 15    Comment: Performed at [facility], [address] [place], [place] [address]  Ethanol     Status: None   Collection Time: 02/25/21  5:44 PM  Result Value Ref Range   Alcohol, Ethyl (B) <10 <10 mg/dL    Comment: (NOTE) Lowest detectable limit for serum alcohol is 10 mg/dL.  For medical purposes only. Performed at [facility], [address] [place], [place] [address]   CBC with Diff     Status: None   Collection Time: 02/25/21  5:44 PM  Result Value Ref Range   WBC 5.8 4.0 - 10.5 K/uL   RBC 4.61 4.22 - 5.81 MIL/uL   Hemoglobin 14.2 13.0 - 17.0 g/dL   HCT 16.0 10.9 - 32.3 %   MCV 92.4 80.0 - 100.0 fL   MCH 30.8 26.0 - 34.0 pg   MCHC 33.3 30.0 - 36.0 g/dL   RDW 55.7 32.2 - 02.5 %   Platelets 158 150 - 400 K/uL   nRBC 0.0 0.0 - 0.2 %   Neutrophils Relative % 51 %   Neutro Abs 3.0 1.7 - 7.7 K/uL   Lymphocytes Relative 41 %   Lymphs Abs 2.4 0.7 - 4.0 K/uL   Monocytes Relative 8 %   Monocytes Absolute 0.4 0.1 - 1.0 K/uL   Eosinophils Relative 0 %   Eosinophils Absolute 0.0 0.0 - 0.5 K/uL   Basophils  Relative 0 %   Basophils Absolute 0.0 0.0 - 0.1 K/uL   Immature Granulocytes 0 %   Abs Immature Granulocytes 0.00 0.00 - 0.07 K/uL    Comment: Performed at [facility], [address] [place], [place] [address]  Resp Panel by RT-PCR (Flu A&B, Covid) Nasopharyngeal Swab     Status: None   Collection Time: 02/25/21  5:50 PM   Specimen: Nasopharyngeal Swab; Nasopharyngeal(NP) swabs in vial transport medium  Result Value Ref Range   SARS Coronavirus 2 by RT PCR NEGATIVE NEGATIVE    Comment: (NOTE) SARS-CoV-2 target nucleic acids are NOT DETECTED.  The SARS-CoV-2 RNA is generally detectable in upper respiratory specimens during the acute phase of infection. The lowest concentration of SARS-CoV-2 viral copies this assay can detect is 138 copies/mL. A negative result does not preclude SARS-Cov-2 infection and should not be used as the sole basis for treatment or other patient management decisions. A negative result may occur with  improper specimen collection/handling, submission of specimen other than nasopharyngeal swab, presence of viral mutation(s) within the areas targeted by this assay, and inadequate number of viral copies(<138 copies/mL). A negative result must be combined with clinical observations, patient history, and epidemiological information. The expected result is Negative.  Fact Sheet for Patients:  BloggerCourse.com  Fact Sheet for Healthcare Providers:  SeriousBroker.it  This test is no t yet approved or cleared by the [place] FDA and  has been authorized for detection and/or diagnosis of SARS-CoV-2 by FDA under an Emergency Use Authorization (EUA). This EUA will remain  in effect (meaning this test can be used) for the duration of the COVID-19 declaration under Section 564(b)(1) of the Act,  21 U.S.C.section 360bbb-3(b)(1), unless the authorization is terminated  or revoked sooner.        Influenza A by PCR NEGATIVE NEGATIVE   Influenza B by PCR NEGATIVE NEGATIVE    Comment: (NOTE) The Xpert Xpress SARS-CoV-2/FLU/RSV plus assay is intended as an aid in the diagnosis of influenza from Nasopharyngeal swab specimens and should not be used as a sole basis for treatment. Nasal washings and aspirates are unacceptable for Xpert Xpress SARS-CoV-2/FLU/RSV testing.  Fact Sheet for Patients: BloggerCourse.com  Fact Sheet for Healthcare Providers: SeriousBroker.it  This test is not yet approved or cleared by the [place] FDA and has been authorized for detection and/or diagnosis of SARS-CoV-2 by FDA under an Emergency Use Authorization (EUA). This EUA will remain in effect (meaning this test can be used) for the duration of the COVID-19 declaration under Section 564(b)(1) of the Act, 21 U.S.C. section 360bbb-3(b)(1), unless the authorization is terminated or revoked.  Performed at [facility], [address] [place], [place] [address]   Urine rapid drug screen (hosp performed)     Status: Abnormal   Collection Time: 02/25/21  5:50 PM  Result Value Ref Range   Opiates NONE DETECTED NONE DETECTED   Cocaine NONE DETECTED NONE DETECTED   Benzodiazepines NONE DETECTED NONE DETECTED   Amphetamines NONE DETECTED NONE DETECTED   Tetrahydrocannabinol POSITIVE (A) NONE DETECTED   Barbiturates NONE DETECTED NONE DETECTED    Comment: (NOTE) DRUG SCREEN FOR MEDICAL PURPOSES ONLY.  IF CONFIRMATION IS NEEDED FOR ANY PURPOSE, NOTIFY LAB WITHIN 5 DAYS.  LOWEST DETECTABLE LIMITS FOR URINE DRUG SCREEN Drug Class                     Cutoff (ng/mL) Amphetamine and metabolites    1000 Barbiturate and metabolites    200 Benzodiazepine                 200 Tricyclics and metabolites     300 Opiates and metabolites        300 Cocaine and metabolites        300 THC                            50 Performed at [facility], [address] [place], [place] [address]     Medications:  No current facility-administered medications for this encounter.   Current Outpatient Medications  Medication Sig Dispense Refill  . sertraline (ZOLOFT) 100 MG tablet Take by mouth.    . divalproex (DEPAKOTE ER) 500 MG 24 hr tablet Take 1,000 mg by mouth at bedtime.    Hinda Glatter SUSTENNA 156 MG/ML SUSY injection Inject into the muscle.      Musculoskeletal: Strength & Muscle Tone: within normal limits Gait & Station: normal Patient leans: N/A  Psychiatric Specialty Exam: Physical Exam  Review of Systems  Blood pressure 133/85, pulse 70, temperature 98.3 F (36.8 C), temperature source Oral, resp. rate 16, height  (1.93 m), weight 79.2 kg, SpO2 100 %.Body mass index is 21.25 kg/m.  General Appearance: Casual  Eye Contact:  Fair  Speech:  Clear and Coherent and Normal Rate  Volume:  Normal  Mood:  Euthymic and guarded  Affect:  Congruent and nonchalant  Thought Process:  Coherent, Linear and Descriptions of Associations: Intact  Orientation:  Full (Time, Place, and Person)  Thought Content:  Paranoid Ideation  Suicidal Thoughts:  No  Homicidal Thoughts:  No  Memory:  Immediate;   Fair Recent;   Fair  Judgement:  Fair  Insight:  Present  Psychomotor Activity:  Normal  Concentration:  Concentration: Fair and Attention Span: Fair  Recall:  [employer] of Knowledge:  Good  Language:  Good  Akathisia:  No  Handed:  Right  AIMS (if indicated):     Assets:  Communication Skills Desire for Improvement Housing Leisure Time Resilience Social Support Transportation  ADL's:  Intact  Cognition:  WNL  Sleep:       Treatment Plan Summary: Plan Transfer to [facility] for safety and stabilization; continuous assessment  Medication management:  Start Zyprexa Zydis 5 mg daily  Disposition: Transfer to [facility] for continuous assessment; stabilization, medication management  This service was provided  via telemedicine using a 2-way, interactive audio and Immunologist.  Names of all persons participating in this telemedicine service and their role in this encounter. Name: Assunta Found Role: NP  Name: Dr. Nelly Rout Role: Psychiatrist  Name: Darel Hong Role: Patient  Name: Juleen Starr Role: Patients' mother   Sent a secure message to Dr. Fredderick Phenix informing:  Patient can be transferred to [facility] to continuous assessment unit.  Can please get urinalysis, TSH, and Lipid panel before he leaves, please our labs need to be sent out.  Also nursing to call report to see what the best time is for patient to transfer.     Lyriq Jarchow, NP 02/26/2021 11:59 AM

## 2021-02-27 DIAGNOSIS — F19959 Other psychoactive substance use, unspecified with psychoactive substance-induced psychotic disorder, unspecified: Secondary | ICD-10-CM

## 2021-02-27 DIAGNOSIS — F1210 Cannabis abuse, uncomplicated: Secondary | ICD-10-CM | POA: Diagnosis not present

## 2021-02-27 DIAGNOSIS — F12959 Cannabis use, unspecified with psychotic disorder, unspecified: Secondary | ICD-10-CM

## 2021-02-27 MED ORDER — LORAZEPAM 2 MG/ML IJ SOLN
2.0000 mg | Freq: Once | INTRAMUSCULAR | Status: AC
  Administered 2021-02-27: 2 mg via INTRAMUSCULAR
  Filled 2021-02-27: qty 1

## 2021-02-27 MED ORDER — DIPHENHYDRAMINE HCL 50 MG/ML IJ SOLN
50.0000 mg | Freq: Once | INTRAMUSCULAR | Status: AC
  Administered 2021-02-27: 50 mg via INTRAMUSCULAR
  Filled 2021-02-27: qty 1

## 2021-02-27 MED ORDER — HALOPERIDOL LACTATE 5 MG/ML IJ SOLN
5.0000 mg | Freq: Once | INTRAMUSCULAR | Status: AC
  Administered 2021-02-27: 5 mg via INTRAMUSCULAR
  Filled 2021-02-27: qty 1

## 2021-02-27 NOTE — ED Notes (Signed)
Discharge instructions provided and Pt stated understanding. Personal belongings returned from locker. Pt escorted to the front lobby to meet parents to go home. Pt alert, orient and ambulatory. Safety maintained.

## 2021-02-27 NOTE — Progress Notes (Signed)
Pt under review at [facility]. 

## 2021-02-27 NOTE — ED Notes (Signed)
Pt given breakfast.

## 2021-02-27 NOTE — ED Provider Notes (Signed)
Behavioral Health Progress Note  Date and Time: 02/27/2021 11:54 AM Name: Ethan Oconnor MRN:  [id number]  Subjective: Patient evaluated at bedside this morning.  He was extremely sedated and only was partially able to participate in psychiatric interview.  He denies suicidal ideation or homicidal ideations.  He admits to auditory and visual hallucinations but was unable to share the content of.  He states that he was taking his medication home and was smoking marijuana every day. Of note patient received Ativan 2 mg, Benadryl 50 mg and Haldol 5 mg this morning at~6:30 for extremely aggressive behavior.  Diagnosis:  Final diagnoses:  Psychoactive substance-induced psychosis (HCC)  Cannabis abuse  Cannabis-induced psychotic disorder (HCC)    Total Time spent with patient: 15 minutes  Past Psychiatric History: Cannabis-induced psychosis, schizophrenia Past Medical History: History reviewed. No pertinent past medical history.  Past Surgical History:  Procedure Laterality Date  . TONSILLECTOMY     Family History:  Family History  Problem Relation Age of Onset  . Sudden death Neg Hx   . Heart attack Neg Hx    Family Psychiatric  History: No known family psychiatric history Social History:  Social History   Substance and Sexual Activity  Alcohol Use Yes     Social History   Substance and Sexual Activity  Drug Use Yes  . Types: Marijuana    Social History   Socioeconomic History  . Marital status: Single    Spouse name: Not on file  . Number of children: Not on file  . Years of education: Not on file  . Highest education level: Not on file  Occupational History  . Not on file  Tobacco Use  . Smoking status: Never Smoker  . Smokeless tobacco: Never Used  Substance and Sexual Activity  . Alcohol use: Yes  . Drug use: Yes    Types: Marijuana  . Sexual activity: Not on file  Other Topics Concern  . Not on file  Social History Narrative  . Not on file   Social  Determinants of Health   Financial Resource Strain: Not on file  Food Insecurity: Not on file  Transportation Needs: Not on file  Physical Activity: Not on file  Stress: Not on file  Social Connections: Not on file   SDOH:  SDOH Screenings   Alcohol Screen: Not on file  Depression (PHQ2-9): Low Risk   . PHQ-2 Score: 1  Financial Resource Strain: Not on file  Food Insecurity: Not on file  Housing: Not on file  Physical Activity: Not on file  Social Connections: Not on file  Stress: Not on file  Tobacco Use: Low Risk   . Smoking Tobacco Use: Never Smoker  . Smokeless Tobacco Use: Never Used  Transportation Needs: Not on file   Additional Social History:                         Sleep: Fair  Appetite:  Fair  Current Medications:  Current Facility-Administered Medications  Medication Dose Route Frequency Provider Last Rate Last Admin  . acetaminophen (TYLENOL) tablet 650 mg  650 mg Oral Q6H PRN Rankin, Shuvon B, NP      . alum & mag hydroxide-simeth (MAALOX/MYLANTA) 200-200-20 MG/5ML suspension 30 mL  30 mL Oral Q4H PRN Rankin, Shuvon B, NP      . hydrOXYzine (ATARAX/VISTARIL) tablet 25 mg  25 mg Oral TID PRN Rankin, Shuvon B, NP      . magnesium hydroxide (MILK  OF MAGNESIA) suspension 30 mL  30 mL Oral Daily PRN Rankin, Shuvon B, NP      . OLANZapine (ZYPREXA) tablet 5 mg  5 mg Oral QHS Rankin, Shuvon B, NP   5 mg at 02/26/21 2152  . traZODone (DESYREL) tablet 50 mg  50 mg Oral QHS PRN Rankin, Shuvon B, NP   50 mg at 02/26/21 2151   Current Outpatient Medications  Medication Sig Dispense Refill  . divalproex (DEPAKOTE ER) 500 MG 24 hr tablet Take 1,000 mg by mouth at bedtime.    Hinda Glatter SUSTENNA 156 MG/ML SUSY injection Inject into the muscle.    . sertraline (ZOLOFT) 100 MG tablet Take by mouth.      Labs  Lab Results:  Admission on 02/25/2021, Discharged on 02/26/2021  Component Date Value Ref Range Status  . SARS Coronavirus 2 by RT PCR 02/25/2021  NEGATIVE  NEGATIVE Final   Comment: (NOTE) SARS-CoV-2 target nucleic acids are NOT DETECTED.  The SARS-CoV-2 RNA is generally detectable in upper respiratory specimens during the acute phase of infection. The lowest concentration of SARS-CoV-2 viral copies this assay can detect is 138 copies/mL. A negative result does not preclude SARS-Cov-2 infection and should not be used as the sole basis for treatment or other patient management decisions. A negative result may occur with  improper specimen collection/handling, submission of specimen other than nasopharyngeal swab, presence of viral mutation(s) within the areas targeted by this assay, and inadequate number of viral copies(<138 copies/mL). A negative result must be combined with clinical observations, patient history, and epidemiological information. The expected result is Negative.  Fact Sheet for Patients:  BloggerCourse.com  Fact Sheet for Healthcare Providers:  SeriousBroker.it  This test is no                          t yet approved or cleared by the [place] FDA and  has been authorized for detection and/or diagnosis of SARS-CoV-2 by FDA under an Emergency Use Authorization (EUA). This EUA will remain  in effect (meaning this test can be used) for the duration of the COVID-19 declaration under Section 564(b)(1) of the Act, 21 U.S.C.section 360bbb-3(b)(1), unless the authorization is terminated  or revoked sooner.      . Influenza A by PCR 02/25/2021 NEGATIVE  NEGATIVE Final  . Influenza B by PCR 02/25/2021 NEGATIVE  NEGATIVE Final   Comment: (NOTE) The Xpert Xpress SARS-CoV-2/FLU/RSV plus assay is intended as an aid in the diagnosis of influenza from Nasopharyngeal swab specimens and should not be used as a sole basis for treatment. Nasal washings and aspirates are unacceptable for Xpert Xpress SARS-CoV-2/FLU/RSV testing.  Fact Sheet for  Patients: BloggerCourse.com  Fact Sheet for Healthcare Providers: SeriousBroker.it  This test is not yet approved or cleared by the [place] FDA and has been authorized for detection and/or diagnosis of SARS-CoV-2 by FDA under an Emergency Use Authorization (EUA). This EUA will remain in effect (meaning this test can be used) for the duration of the COVID-19 declaration under Section 564(b)(1) of the Act, 21 U.S.C. section 360bbb-3(b)(1), unless the authorization is terminated or revoked.  Performed at [facility], [address] [place], [place] [address]   . Sodium 02/25/2021 137  135 - 145 mmol/L Final  . Potassium 02/25/2021 3.7  3.5 - 5.1 mmol/L Final  . Chloride 02/25/2021 101  98 - 111 mmol/L Final  . CO2 02/25/2021 27  22 - 32  mmol/L Final  . Glucose, Bld 02/25/2021 84  70 - 99 mg/dL Final   Glucose reference range applies only to samples taken after fasting for at least 8 hours.  . BUN 02/25/2021 18  6 - 20 mg/dL Final  . Creatinine, Ser 02/25/2021 1.27* 0.61 - 1.24 mg/dL Final  . Calcium 16/08/9603 9.1  8.9 - 10.3 mg/dL Final  . Total Protein 02/25/2021 7.6  6.5 - 8.1 g/dL Final  . Albumin 54/07/8118 4.8  3.5 - 5.0 g/dL Final  . AST 14/78/2956 15  15 - 41 U/L Final  . ALT 02/25/2021 11  0 - 44 U/L Final  . Alkaline Phosphatase 02/25/2021 58  38 - 126 U/L Final  . Total Bilirubin 02/25/2021 0.6  0.3 - 1.2 mg/dL Final  . GFR, Estimated 02/25/2021 >60  >60 mL/min Final   Comment: (NOTE) Calculated using the CKD-EPI Creatinine Equation (2021)   . Anion gap 02/25/2021 9  5 - 15 Final   Performed at [facility], [address] [place], [place] [address]  . Alcohol, Ethyl (B) 02/25/2021 <10  <10 mg/dL Final   Comment: (NOTE) Lowest detectable limit for serum alcohol is 10 mg/dL.  For medical purposes only. Performed at [facility], [address] [place], [place] [address]   .  Opiates 02/25/2021 NONE DETECTED  NONE DETECTED Final  . Cocaine 02/25/2021 NONE DETECTED  NONE DETECTED Final  . Benzodiazepines 02/25/2021 NONE DETECTED  NONE DETECTED Final  . Amphetamines 02/25/2021 NONE DETECTED  NONE DETECTED Final  . Tetrahydrocannabinol 02/25/2021 POSITIVE* NONE DETECTED Final  . Barbiturates 02/25/2021 NONE DETECTED  NONE DETECTED Final   Comment: (NOTE) DRUG SCREEN FOR MEDICAL PURPOSES ONLY.  IF CONFIRMATION IS NEEDED FOR ANY PURPOSE, NOTIFY LAB WITHIN 5 DAYS.  LOWEST DETECTABLE LIMITS FOR URINE DRUG SCREEN Drug Class                     Cutoff (ng/mL) Amphetamine and metabolites    1000 Barbiturate and metabolites    200 Benzodiazepine                 200 Tricyclics and metabolites     300 Opiates and metabolites        300 Cocaine and metabolites        300 THC                            50 Performed at [facility], [address] [place], [place] [address]   . WBC 02/25/2021 5.8  4.0 - 10.5 K/uL Final  . RBC 02/25/2021 4.61  4.22 - 5.81 MIL/uL Final  . Hemoglobin 02/25/2021 14.2  13.0 - 17.0 g/dL Final  . HCT 52/84/1324 42.6  39.0 - 52.0 % Final  . MCV 02/25/2021 92.4  80.0 - 100.0 fL Final  . MCH 02/25/2021 30.8  26.0 - 34.0 pg Final  . MCHC 02/25/2021 33.3  30.0 - 36.0 g/dL Final  . RDW 40/08/2724 12.4  11.5 - 15.5 % Final  . Platelets 02/25/2021 158  150 - 400 K/uL Final  . nRBC 02/25/2021 0.0  0.0 - 0.2 % Final  . Neutrophils Relative % 02/25/2021 51  % Final  . Neutro Abs 02/25/2021 3.0  1.7 - 7.7 K/uL Final  . Lymphocytes Relative 02/25/2021 41  % Final  . Lymphs Abs 02/25/2021 2.4  0.7 - 4.0 K/uL Final  .  Monocytes Relative 02/25/2021 8  % Final  . Monocytes Absolute 02/25/2021 0.4  0.1 - 1.0 K/uL Final  . Eosinophils Relative 02/25/2021 0  % Final  . Eosinophils Absolute 02/25/2021 0.0  0.0 - 0.5 K/uL Final  . Basophils Relative 02/25/2021 0  % Final  . Basophils Absolute 02/25/2021 0.0  0.0 - 0.1 K/uL Final  . Immature  Granulocytes 02/25/2021 0  % Final  . Abs Immature Granulocytes 02/25/2021 0.00  0.00 - 0.07 K/uL Final   Performed at [facility], [address] [place], [place] [address]  . Color, Urine 02/25/2021 YELLOW  YELLOW Final  . APPearance 02/25/2021 CLOUDY* CLEAR Final  . Specific Gravity, Urine 02/25/2021 1.025  1.005 - 1.030 Final  . pH 02/25/2021 6.0  5.0 - 8.0 Final  . Glucose, UA 02/25/2021 NEGATIVE  NEGATIVE mg/dL Final  . Hgb urine dipstick 02/25/2021 NEGATIVE  NEGATIVE Final  . Bilirubin Urine 02/25/2021 SMALL* NEGATIVE Final  . Ketones, ur 02/25/2021 40* NEGATIVE mg/dL Final  . Protein, ur 19/14/782904/09/2021 NEGATIVE  NEGATIVE mg/dL Final  . Nitrite 56/21/308604/09/2021 NEGATIVE  NEGATIVE Final  . Glori LuisLeukocytes,Ua 02/25/2021 NEGATIVE  NEGATIVE Final   Comment: Microscopic not done on urines with negative protein, blood, leukocytes, nitrite, or glucose < 500 mg/dL. Performed at [facility], [address] [place], [place] [address]   . TSH 02/26/2021 1.436  0.350 - 4.500 uIU/mL Final   Comment: Performed by a 3rd Generation assay with a functional sensitivity of <=0.01 uIU/mL. Performed at [facility], [address] [place], [place] [address]   . Cholesterol 02/26/2021 152  0 - 200 mg/dL Final  . Triglycerides 02/26/2021 41  <150 mg/dL Final  . HDL 28/41/324404/10/2021 41  >40 mg/dL Final  . Total CHOL/HDL Ratio 02/26/2021 3.7  RATIO Final  . VLDL 02/26/2021 8  0 - 40 mg/dL Final  . LDL Cholesterol 02/26/2021 103* 0 - 99 mg/dL Final   Comment:        Total Cholesterol/HDL:CHD Risk Coronary Heart Disease Risk Table                     Men   Women  1/2 Average Risk   3.4   3.3  Average Risk       5.0   4.4  2 X Average Risk   9.6   7.1  3 X Average Risk  23.4   11.0        Use the calculated Patient Ratio above and the CHD Risk Table to determine the patient's CHD Risk.        ATP III CLASSIFICATION (LDL):  <100     mg/dL   Optimal  [id number]  mg/dL   Near or Above                     Optimal  130-159  mg/dL   Borderline  [id number]  mg/dL   High  >034>190     mg/dL   Very High Performed at [facility], [address] [place], [place] [address]     Blood Alcohol level:  Lab Results  Component Value Date   ETH <10 02/25/2021    Metabolic Disorder Labs: No results found for: HGBA1C, MPG No results found for: PROLACTIN Lab Results  Component Value Date   CHOL 152 02/26/2021   TRIG 41 02/26/2021   HDL 41 02/26/2021   CHOLHDL 3.7 02/26/2021  VLDL 8 02/26/2021   LDLCALC 103 (H) 02/26/2021    Therapeutic Lab Levels: No results found for: LITHIUM No results found for: VALPROATE No components found for:  CBMZ  Physical Findings   PHQ2-9   Flowsheet Row ED from 02/25/2021 in [facility]  PHQ-2 Total Score 0  PHQ-9 Total Score 1    Flowsheet Row ED from 02/26/2021 in [facility] ED from 02/25/2021 in [facility]  C-SSRS RISK CATEGORY No Risk No Risk       Musculoskeletal  Strength & Muscle Tone: within normal limits Gait & Station: normal Patient leans: N/A  Psychiatric Specialty Exam  Presentation  General Appearance: Disheveled  Eye Contact:Minimal  Speech:Garbled  Speech Volume:Increased  Handedness:Right   Mood and Affect  Mood:Irritable  Affect:Non-Congruent   Thought Process  Thought Processes:Disorganized  Descriptions of Associations:Circumstantial  Orientation:Partial  Thought Content:Illogical; Delusions; Rumination; Paranoid Ideation  Diagnosis of Schizophrenia or Schizoaffective disorder in past: Yes  Duration of Psychotic Symptoms: Greater than six months   Hallucinations:Hallucinations: Auditory; Visual Description of Auditory Hallucinations: Unable to explain Description of Visual Hallucinations: Unable to share  Ideas of Reference:Paranoia  Suicidal Thoughts:Suicidal Thoughts: No  Homicidal  Thoughts:Homicidal Thoughts: No   Sensorium  Memory:Immediate Fair; Recent Fair; Remote Fair  Judgment:Impaired  Insight:Lacking   Executive Functions  Concentration:Fair  Attention Span:Fair  Recall:Fair  Fund of Knowledge:Good  Language:Good   Psychomotor Activity  Psychomotor Activity:Psychomotor Activity: Increased   Assets  Assets:Resilience; Social Support; Housing; Financial Resources/Insurance   Sleep  Sleep:Sleep: Fair   Nutritional Assessment (For OBS and FBC admissions only) Has the patient had a weight loss or gain of 10 pounds or more in the last 3 months?: No Has the patient had a decrease in food intake/or appetite?: No Does the patient have dental problems?: No Does the patient have eating habits or behaviors that may be indicators of an eating disorder including binging or inducing vomiting?: No Has the patient recently lost weight without trying?: No Has the patient been eating poorly because of a decreased appetite?: No Malnutrition Screening Tool Score: 0    Physical Exam  Physical Exam Vitals and nursing note reviewed.  Constitutional:      Appearance: Normal appearance.  HENT:     Head: Normocephalic and atraumatic.     Nose: Nose normal.     Mouth/Throat:     Mouth: Mucous membranes are moist.  Cardiovascular:     Rate and Rhythm: Normal rate and regular rhythm.     Pulses: Normal pulses.  Pulmonary:     Effort: Pulmonary effort is normal.  Skin:    General: Skin is warm.  Neurological:     Mental Status: He is alert.    Review of Systems  Constitutional: Negative.   Cardiovascular: Negative.   Neurological: Negative.   Psychiatric/Behavioral: Positive for depression, hallucinations and substance abuse. The patient is nervous/anxious.    Blood pressure 117/77, pulse 65, temperature 97.7 F (36.5 C), temperature source Temporal, resp. rate 16, SpO2 100 %. There is no height or weight on file to calculate BMI.  Treatment  Plan Summary: --On evaluation today patient denies suicidal or homicidal ideation but clearly explained responding to internal stimuli.  He was extremely sedated and only participated partially in psychiatric interview .--Currently he is psychotic, medication noncompliant and meets the criteria for inpatient psychiatric hospitalization.  He would benefit for inpatient psychiatric hospitalization for medication management for ongoing psychosis.  Geralynn Rile  Ethan Lumpkin, MD 02/27/2021 11:54 AM  PGY-1, Resident

## 2021-02-27 NOTE — ED Notes (Signed)
Pt offered a meal;declined and requested a snack; given snack.

## 2021-02-27 NOTE — Progress Notes (Signed)
Patient throwing trash can around area. Attempted to pick up chair. Patient was briefly placed in manual hold by staff. Sitting calmly on bed/chair at this time.

## 2021-02-27 NOTE — ED Provider Notes (Signed)
FBC/OBS ASAP Discharge Summary  Date and Time: 02/27/2021 6:00 PM  Name: Ethan Oconnor  MRN:  [id number]   Discharge Diagnoses:  Final diagnoses:  Psychoactive substance-induced psychosis (HCC)  Cannabis abuse  Cannabis-induced psychotic disorder (HCC)    Subjective: Patient evaluated at bedside this evening.  He denies any suicidal or homicidal ideations.  He denies any auditory or visual hallucinations.  He states that he understands he is in hospital because he was acting abnormal after smoking marijuana.  He states that he smokes marijuana to relax himself and plans to continue using it.  He adds that he might try to decrease the amount of marijuana but plans to continue smoking.  He states that he is not going to take any medication to help with anxiety or mood and he himself think that he is normal and nothing is wrong with him.  Phone conversation with parents: Parents were called and ask about patient's presenting symptoms and duration and they informed the provider that it all started~48 hours ago after he was smoking cannabis heavily.  He was sleeping and eating well and able to go to college, attend his classes and had a relationship with girlfriend normally before last 48 hours.  As per mother patient started acting paranoid, stating that he might go to prison for smoking marijuana and was hyper religious which was unusual of his character.  They were informed that patient does not meet criteria for inpatient psychiatric hospitalization or forced medication.  They are agreeable to take him back home and all their questions were answered appropriately.  They were appreciative of the help received and were told to come back anytime they are concerned about the patient.  Stay Summary: Ethan Oconnor is a 25 year old male who presented under IVC to [facility] for direct admit and continuous assessment from [facility] ED for ongoing psychosis. His UDS he was positive for marijuana.  He received Zyprexa 5  mg at night and due to his agitated behavior in the morning received 2 mg Ativan, 50 mg Benadryl, 5 mg Haldol.  Patient slept after that and was reevaluated in evening.  He was not suicidal, homicidal, and was not seen responding to any internal stimuli.  He was provided with options to take medications to help with his anxiety and explained that he might be self-medicating himself with marijuana but patient denies at this time and plans to decrease amount of marijuana smoking.  Total Time spent with patient: 30 minutes  Past Psychiatric History: Cannabis induced psychosis, MDD, GAD Past Medical History: History reviewed. No pertinent past medical history.  Past Surgical History:  Procedure Laterality Date  . TONSILLECTOMY     Family History:  Family History  Problem Relation Age of Onset  . Sudden death Neg Hx   . Heart attack Neg Hx    Family Psychiatric History: Patient's states that his sister has depression and anxiety, Aunt has bipolar. Social History:  Social History   Substance and Sexual Activity  Alcohol Use Yes     Social History   Substance and Sexual Activity  Drug Use Yes  . Types: Marijuana    Social History   Socioeconomic History  . Marital status: Single    Spouse name: Not on file  . Number of children: Not on file  . Years of education: Not on file  . Highest education level: Not on file  Occupational History  . Not on file  Tobacco Use  . Smoking status: Never Smoker  .  Smokeless tobacco: Never Used  Substance and Sexual Activity  . Alcohol use: Yes  . Drug use: Yes    Types: Marijuana  . Sexual activity: Not on file  Other Topics Concern  . Not on file  Social History Narrative  . Not on file   Social Determinants of Health   Financial Resource Strain: Not on file  Food Insecurity: Not on file  Transportation Needs: Not on file  Physical Activity: Not on file  Stress: Not on file  Social Connections: Not on file   SDOH:  SDOH  Screenings   Alcohol Screen: Not on file  Depression (PHQ2-9): Low Risk   . PHQ-2 Score: 1  Financial Resource Strain: Not on file  Food Insecurity: Not on file  Housing: Not on file  Physical Activity: Not on file  Social Connections: Not on file  Stress: Not on file  Tobacco Use: Low Risk   . Smoking Tobacco Use: Never Smoker  . Smokeless Tobacco Use: Never Used  Transportation Needs: Not on file    Has this patient used any form of tobacco in the last 30 days? No Current Medications:  Current Facility-Administered Medications  Medication Dose Route Frequency Provider Last Rate Last Admin  . acetaminophen (TYLENOL) tablet 650 mg  650 mg Oral Q6H PRN Rankin, Shuvon B, NP      . alum & mag hydroxide-simeth (MAALOX/MYLANTA) 200-200-20 MG/5ML suspension 30 mL  30 mL Oral Q4H PRN Rankin, Shuvon B, NP      . hydrOXYzine (ATARAX/VISTARIL) tablet 25 mg  25 mg Oral TID PRN Rankin, Shuvon B, NP      . magnesium hydroxide (MILK OF MAGNESIA) suspension 30 mL  30 mL Oral Daily PRN Rankin, Shuvon B, NP      . OLANZapine (ZYPREXA) tablet 5 mg  5 mg Oral QHS Rankin, Shuvon B, NP   5 mg at 02/26/21 2152  . traZODone (DESYREL) tablet 50 mg  50 mg Oral QHS PRN Rankin, Shuvon B, NP   50 mg at 02/26/21 2151   Current Outpatient Medications  Medication Sig Dispense Refill  . divalproex (DEPAKOTE ER) 500 MG 24 hr tablet Take 1,000 mg by mouth at bedtime. (Patient not taking: Reported on 02/27/2021)    . INVEGA SUSTENNA 156 MG/ML SUSY injection Inject into the muscle. (Patient not taking: Reported on 02/27/2021)    . sertraline (ZOLOFT) 100 MG tablet Take by mouth. (Patient not taking: Reported on 02/27/2021)      PTA Medications: (Not in a hospital admission)   Musculoskeletal  Strength & Muscle Tone: within normal limits Gait & Station: normal Patient leans: N/A  Psychiatric Specialty Exam  Presentation  General Appearance: Appropriate for Environment  Eye Contact:Good  Speech:Normal  Rate  Speech Volume:Normal  Handedness:Right   Mood and Affect  Mood:Dysphoric  Affect:Appropriate   Thought Process  Thought Processes:Coherent  Descriptions of Associations:Intact  Orientation:Full (Time, Place and Person)  Thought Content:Logical  Diagnosis of Schizophrenia or Schizoaffective disorder in past: No  Duration of Psychotic Symptoms: Less than six months   Hallucinations:Hallucinations: None Description of Auditory Hallucinations: Unable to explain Description of Visual Hallucinations: Unable to share  Ideas of Reference:None  Suicidal Thoughts:Suicidal Thoughts: No  Homicidal Thoughts:Homicidal Thoughts: No   Sensorium  Memory:Immediate Good; Recent Good; Remote Good  Judgment:Fair  Insight:Fair   Executive Functions  Concentration:Good  Attention Span:Good  Recall:Good  Fund of Knowledge:Good  Language:Good   Psychomotor Activity  Psychomotor Activity:Psychomotor Activity: Normal   Assets  Assets:Communication  Skills; Housing; Resilience; Agricultural engineer; Vocational/Educational   Sleep  Sleep:Sleep: Good   Nutritional Assessment (For OBS and FBC admissions only) Has the patient had a weight loss or gain of 10 pounds or more in the last 3 months?: No Has the patient had a decrease in food intake/or appetite?: No Does the patient have dental problems?: No Does the patient have eating habits or behaviors that may be indicators of an eating disorder including binging or inducing vomiting?: No Has the patient recently lost weight without trying?: No Has the patient been eating poorly because of a decreased appetite?: No Malnutrition Screening Tool Score: 0    Physical Exam  Physical Exam Vitals and nursing note reviewed.  Constitutional:      Appearance: Normal appearance.  HENT:     Head: Normocephalic and atraumatic.     Nose: Nose normal.     Mouth/Throat:     Mouth: Mucous membranes are moist.   Cardiovascular:     Rate and Rhythm: Normal rate and regular rhythm.     Pulses: Normal pulses.  Pulmonary:     Effort: Pulmonary effort is normal.  Skin:    General: Skin is warm.  Neurological:     Mental Status: He is alert and oriented to person, place, and time.    Review of Systems  Constitutional: Negative.   HENT: Negative.   Eyes: Negative.   Gastrointestinal: Negative.   Skin: Negative.   Neurological: Negative.   Psychiatric/Behavioral: The patient is nervous/anxious.    Blood pressure 117/77, pulse 65, temperature 97.7 F (36.5 C), temperature source Temporal, resp. rate 16, SpO2 100 %. There is no height or weight on file to calculate BMI.  Demographic Factors:  Male and Adolescent or young adult  Loss Factors: NA  Historical Factors: Family history of mental illness or substance abuse and Impulsivity  Risk Reduction Factors:   Sense of responsibility to family, Employed, Living with another person, especially a relative and Positive social support  Continued Clinical Symptoms:  Severe Anxiety and/or Agitation Alcohol/Substance Abuse/Dependencies Unstable or Poor Therapeutic Relationship Previous Psychiatric Diagnoses and Treatments  Cognitive Features That Contribute To Risk:  Closed-mindedness    Suicide Risk:  Minimal: No identifiable suicidal ideation.  Patients presenting with no risk factors but with morbid ruminations; may be classified as minimal risk based on the severity of the depressive symptoms  Plan Of Care/Follow-up recommendations:  --On evaluation today patient denies suicidal or homicidal ideations.  He does not seem like responding to internal stimuli and denies paranoia, hyper religious or any other complaints.  He denies discontinuing marijuana use at this point and denies intake of any medications to help with anxiety. --He is not suicidal, homicidal or psychotic and does not meet criteria for inpatient psychiatric hospitalization.   He is not harm to self or others and he would benefit from outpatient psychiatry or therapy to help with anxiety and substance abuse treatment. --Parents are informed about the patient's ongoing situation and they are agreeable to the plan of picking him up this evening and taking him back home.  Safety planning done with the patient. --Patient was involuntarily committed but he does not meet criteria anymore and his IVC is rescinded.  Disposition: Self-care, parents picking up.  Arnoldo Lenis, MD 02/27/2021, 6:00 PM  PGY-1, Resident

## 2021-02-27 NOTE — ED Provider Notes (Signed)
Patient became agitated after being awakened by another patient screaming. Patient attempting to elope. Receptive to receiving IM medications. Nursing administered haldol and benadryl IM without resistance from the patient.

## 2021-02-27 NOTE — ED Notes (Signed)
Pt awake now, was on the phone with his parents and currently walking in the observation unit. Safety maintained and will continue to monitor.

## 2021-02-27 NOTE — ED Notes (Signed)
Pt pulled the fire alarm. When questioned why he pulled the fire alarm he responded, "Because I'm ready to go". Security in attendance and was able to silience the alarm. Safety maintained.

## 2021-02-27 NOTE — ED Notes (Signed)
Pt pushing at exit doors, attempting to leave unit.  Unable to redirect pt back to stretcher.  Security and Sheriffs [employer] at bedside for assistance as needed.

## 2021-02-27 NOTE — ED Notes (Signed)
Pt throwing trash cans about the department. NP Nira Conn aware.  Ativan 2 mg's IM given without incident or force.  Security and Sheriffs [employer] on standby as needed.

## 2021-02-27 NOTE — ED Notes (Signed)
Pt resting on pull out bed w/o any distress. Safety maintained and will continue to monitor. 

## 2021-02-27 NOTE — Progress Notes (Signed)
Patient meets inpatient criteria per Dr. Geralynn Rile Dagar.  Patient was denied a bed at [facility] due to no beds available.   Patient was referred to the following facilities:   Destination  Service Provider Address Phone Fax  [facility]  [address] [place] [address] [phone]  [facility]  [address], [place] [address] [phone]  [facility]  [address], [place] [address] [phone]  [facility]  [address] [place] [place] [address] [phone]  [facility]  [address] [place] [address] [phone]  [facility]  [address] [place] [address] [phone]  [facility]  [address] [place], [place] [address] [phone]  [facility]  [address] [place], [place] [address] [phone]  [facility]  [address] [place] [address] [phone]  [facility]  [address], [place] [address] [phone]  [facility]  [address] [place] [address] [phone]  [facility]  [address], [place] [address] [phone]  [facility]  [address], [place] [address] [phone]    CSW will continue to monitor for disposition.  Anson Oregon, MSW, LCSW 02/27/2021 12:08 PM

## 2021-02-27 NOTE — ED Notes (Signed)
Pt awake & resting at present.  No distress noted.  Monitoring for safety. 

## 2021-02-27 NOTE — ED Notes (Signed)
Pt unable to keep his eyes open. Staggering when he walks. Almost fell from the chair he was sitting in. Staff assisted Pt back to the pull out bed several times to prevent from falling to the floor. Pt accepted help well. Safety maintained and will continue to monitor.

## 2021-09-01 ENCOUNTER — Other Ambulatory Visit: Payer: Self-pay

## 2021-09-01 ENCOUNTER — Ambulatory Visit (HOSPITAL_COMMUNITY)
Admission: EM | Admit: 2021-09-01 | Discharge: 2021-09-02 | Disposition: A | Discharge status: Home / Self Care | Payer: BC Managed Care – PPO | Attending: Behavioral Health | Admitting: Behavioral Health

## 2021-09-01 DIAGNOSIS — Z79899 Other long term (current) drug therapy: Secondary | ICD-10-CM | POA: Diagnosis not present

## 2021-09-01 DIAGNOSIS — Z20822 Contact with and (suspected) exposure to covid-19: Secondary | ICD-10-CM | POA: Insufficient documentation

## 2021-09-01 DIAGNOSIS — F19959 Other psychoactive substance use, unspecified with psychoactive substance-induced psychotic disorder, unspecified: Secondary | ICD-10-CM

## 2021-09-01 DIAGNOSIS — F1290 Cannabis use, unspecified, uncomplicated: Secondary | ICD-10-CM | POA: Insufficient documentation

## 2021-09-01 DIAGNOSIS — F19951 Other psychoactive substance use, unspecified with psychoactive substance-induced psychotic disorder with hallucinations: Secondary | ICD-10-CM | POA: Diagnosis not present

## 2021-09-01 LAB — CBC WITH DIFFERENTIAL/PLATELET
Abs Immature Granulocytes: 0.01 10*3/uL (ref 0.00–0.07)
Basophils Absolute: 0 10*3/uL (ref 0.0–0.1)
Basophils Relative: 0 %
Eosinophils Absolute: 0 10*3/uL (ref 0.0–0.5)
Eosinophils Relative: 0 %
HCT: 42.5 % (ref 39.0–52.0)
Hemoglobin: 14.2 g/dL (ref 13.0–17.0)
Immature Granulocytes: 0 %
Lymphocytes Relative: 39 %
Lymphs Abs: 2.3 10*3/uL (ref 0.7–4.0)
MCH: 31.4 pg (ref 26.0–34.0)
MCHC: 33.4 g/dL (ref 30.0–36.0)
MCV: 94 fL (ref 80.0–100.0)
Monocytes Absolute: 0.4 10*3/uL (ref 0.1–1.0)
Monocytes Relative: 7 %
Neutro Abs: 3.1 10*3/uL (ref 1.7–7.7)
Neutrophils Relative %: 54 %
Platelets: 149 10*3/uL — ABNORMAL LOW (ref 150–400)
RBC: 4.52 MIL/uL (ref 4.22–5.81)
RDW: 12.2 % (ref 11.5–15.5)
WBC: 5.9 10*3/uL (ref 4.0–10.5)
nRBC: 0 % (ref 0.0–0.2)

## 2021-09-01 LAB — RESP PANEL BY RT-PCR (FLU A&B, COVID) ARPGX2
Influenza A by PCR: NEGATIVE
Influenza B by PCR: NEGATIVE
SARS Coronavirus 2 by RT PCR: NEGATIVE

## 2021-09-01 LAB — COMPREHENSIVE METABOLIC PANEL
ALT: 15 U/L (ref 0–44)
AST: 24 U/L (ref 15–41)
Albumin: 4.9 g/dL (ref 3.5–5.0)
Alkaline Phosphatase: 43 U/L (ref 38–126)
Anion gap: 11 (ref 5–15)
BUN: 15 mg/dL (ref 6–20)
CO2: 28 mmol/L (ref 22–32)
Calcium: 10.1 mg/dL (ref 8.9–10.3)
Chloride: 102 mmol/L (ref 98–111)
Creatinine, Ser: 1.09 mg/dL (ref 0.61–1.24)
GFR, Estimated: >60 mL/min (ref >60)
Glucose, Bld: 77 mg/dL (ref 70–99)
Potassium: 4.6 mmol/L (ref 3.5–5.1)
Sodium: 141 mmol/L (ref 135–145)
Total Bilirubin: 0.9 mg/dL (ref 0.3–1.2)
Total Protein: 7.3 g/dL (ref 6.5–8.1)

## 2021-09-01 LAB — TSH: TSH: 1.985 u[IU]/mL (ref 0.350–4.500)

## 2021-09-01 LAB — ETHANOL: Alcohol, Ethyl (B): <10 mg/dL (ref <10)

## 2021-09-01 LAB — POCT URINE DRUG SCREEN - MANUAL ENTRY (I-SCREEN)
POC Amphetamine UR: NOT DETECTED
POC Buprenorphine (BUP): NOT DETECTED
POC Cocaine UR: NOT DETECTED
POC Marijuana UR: POSITIVE — AB
POC Methadone UR: NOT DETECTED
POC Methamphetamine UR: NOT DETECTED
POC Morphine: NOT DETECTED
POC Oxazepam (BZO): NOT DETECTED
POC Oxycodone UR: NOT DETECTED
POC Secobarbital (BAR): NOT DETECTED

## 2021-09-01 LAB — LIPID PANEL
Cholesterol: 149 mg/dL (ref 0–200)
HDL: 51 mg/dL (ref >40)
LDL Cholesterol: 93 mg/dL (ref 0–99)
Total CHOL/HDL Ratio: 2.9 RATIO
Triglycerides: 27 mg/dL (ref <150)
VLDL: 5 mg/dL (ref 0–40)

## 2021-09-01 LAB — POC SARS CORONAVIRUS 2 AG -  ED: SARS Coronavirus 2 Ag: NEGATIVE

## 2021-09-01 LAB — HEMOGLOBIN A1C
Hgb A1c MFr Bld: 5.3 % (ref 4.8–5.6)
Mean Plasma Glucose: 105.41 mg/dL

## 2021-09-01 LAB — POC SARS CORONAVIRUS 2 AG: SARSCOV2ONAVIRUS 2 AG: NEGATIVE

## 2021-09-01 MED ORDER — ACETAMINOPHEN 325 MG PO TABS: 650.0000 mg | ORAL_TABLET | Freq: Four times a day (QID) | ORAL | Status: DC | PRN

## 2021-09-01 MED ORDER — MAGNESIUM HYDROXIDE 400 MG/5ML PO SUSP: 30.0000 mL | Freq: Every day | ORAL | Status: DC | PRN

## 2021-09-01 MED ORDER — ALUM & MAG HYDROXIDE-SIMETH 200-200-20 MG/5ML PO SUSP
30.0000 mL | ORAL | Status: DC | PRN
Start: 2021-09-01 — End: 2021-09-02

## 2021-09-01 MED ORDER — HYDROXYZINE HCL 25 MG PO TABS: 25.0000 mg | ORAL_TABLET | Freq: Three times a day (TID) | ORAL | Status: DC | PRN

## 2021-09-01 MED ORDER — OLANZAPINE 5 MG PO TBDP
5.0000 mg | ORAL_TABLET | Freq: Three times a day (TID) | ORAL | Status: DC | PRN
  Administered 2021-09-02: 5 mg via ORAL
  Filled 2021-09-01: qty 1

## 2021-09-01 MED ORDER — ZIPRASIDONE MESYLATE 20 MG IM SOLR: 20.0000 mg | INTRAMUSCULAR | Status: DC | PRN

## 2021-09-01 MED ORDER — OLANZAPINE 5 MG PO TBDP
ORAL_TABLET | ORAL | Status: AC
  Administered 2021-09-01: 5 mg via ORAL
  Filled 2021-09-01: qty 1

## 2021-09-01 MED ORDER — OLANZAPINE 5 MG PO TBDP: 5.0000 mg | ORAL_TABLET | Freq: Once | ORAL | Status: AC

## 2021-09-01 MED ORDER — LORAZEPAM 1 MG PO TABS
1.0000 mg | ORAL_TABLET | ORAL | Status: AC | PRN
Start: 2021-09-01 — End: 2021-09-02
  Administered 2021-09-02: 1 mg via ORAL
  Filled 2021-09-01: qty 1

## 2021-09-01 NOTE — ED Notes (Addendum)
Pt admitted to continuous assessment due to disorganized thought process and hallucinations. Pt actively responding to internal stimuli laughing inappropriately to self, laying on floor, requires redirecting. Cooperative with staff. Denies SI/HI/AVH. Will monitor for safety.

## 2021-09-01 NOTE — BH Assessment (Signed)
Comprehensive Clinical Assessment (CCA) Note  09/01/2021 Darel Hong [id number]  Disposition:  Gave clinical report to P. White, NP, who determined that Pt shall be admitted to obs.  The patient demonstrates the following risk factors for suicide: Chronic risk factors for suicide include: substance use disorder and demographic factors (male, >24 y/o). Acute risk factors for suicide include: unemployment and social withdrawal/isolation. Protective factors for this patient include: positive social support. Considering these factors, the overall suicide risk at this point appears to be low. Patient is not appropriate for outpatient follow up.   Flowsheet Row ED from [facility]21 in [facility] ED from 02/25/2021 in [facility] EM[facility]PARTMENT  C-SSRS RISK CATEGORY No Risk No Risk       Chief Complaint:  Chief Complaint  Patient presents with   Addiction Problem    Pt presents under IVC (petitioner is mother) -- per report, Pt is hallucinating, threatening suicide.  He endorses daily use of THC, and he stated that he used about 2 hours ago.  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  Pt lives in [place] with his parents, and he is a Consulting civil engineer at [employer].  Per parents, Pt had his first visit with a therapist at [facility] this past week.  PT was last assessed by TTS in April 2022.  Per IVC, Pt is actively hallucinating, stating this mother wants him dead, and acting in a bizarre manner (throwing objects, speaking in a non-sensical way).  Chartered loss adjuster spoke with Pt's parents.  They reported that Pt is smoking up to  4 grams of THC per day, that he is speaking in a bizarre metaphorical manner, and that he is acting erratically.  Specifically, he stole the family car yesterday.  Police brought Pt back home, and he told police and parents that he wants to be homeless.  Per parents, Pt is hallucinating and responding to internal stimuli.  Parents stated that they are concerned about Pt's safety -- his drug use has escalated, and he is acting increasingly erratically and speaking in a disorganized manner.  Parents reported that Pt had his first appointment with a therapist this past week.  The therapist recommended that Pt cut back on his THC use and also suggested that Pt attend an intensive outpatient drug treatment program.  Pt is not yet enrolled.  Author spoke with Pt as well.  Pt was stooped and had a blunted affect.  He reported that he is hearing voices, and he was observed mumbling to himself.  Pt stated that he smokes up to 2 grams of THC per day, and he has no desire to stop.  Pt denied suicidal ideation, homicidal ideation, and self-injurious behavior.  Pt stated that he feels anxious.  He denied despondency.    During assessment, Pt presented as drowsy and oriented x5.  He had normal eye contact and was cooperative.  Pt was dressed in street clothes, and he appeared appropriately groomed.  Mood was anxious.  Affect was blunted.  Pt reported that he had used about 2 grams of THC two hours prior to assessment.  Pt's speech was normal in rate, rhythm, and volume.  Thought processes  were within normal range, and thought content was logical and goal-oriented.  There was no evidence of delusion.  Memory and concentration were intact.  Insight, judgment, and impulse control were fair to poor.  CCA Screening, Triage and Referral (STR)  Patient Reported Information How did you hear about [place]? Family/Friend (IVC)  What Is the Reason for Your Visit/Call Today? Pt states he was brought to the ED for a check-up for his  mental health.  How Long Has This Been Causing You Problems? > than 6 months  What Do You Feel Would Help You the Most Today? Alcohol or Drug Use Treatment; Treatment for Depression or other mood problem   Have You Recently Had Any Thoughts About Hurting Yourself? Yes  Are You Planning to Commit Suicide/Harm Yourself At This time? No   Have you Recently Had Thoughts About Hurting Someone Karolee Ohs? No  Are You Planning to Harm Someone at This Time? No  Explanation: No data recorded  Have You Used Any Alcohol or Drugs in the Past 24 Hours? Yes  How Long Ago Did You Use Drugs or Alcohol? No data recorded What Did You Use and How Much? THC -- 2 grams   Do You Currently Have a Therapist/Psychiatrist? No  Name of Therapist/Psychiatrist: No data recorded  Have You Been Recently Discharged From Any Office Practice or Programs? No  Explanation of Discharge From Practice/Program: No data recorded    CCA Screening Triage Referral Assessment Type of Contact: Tele-Assessment  Telemedicine Service Delivery:   Is this Initial or Reassessment? Initial Assessment  Date Telepsych consult ordered in [facility]:  02/25/21  Time Telepsych consult ordered in [facility]:  1845  Location of Assessment: [facility]  Provider Location: No data recorded  Collateral Involvement: EDP note w/ collateral from pt's mother   Does Patient Have a Court Appointed Legal Guardian? No data recorded Name and Contact of Legal Guardian: No data recorded If Minor and Not Living with Parent(s), Who has Custody? N/A  Is CPS involved or ever been involved? Never  Is APS involved or ever been involved? Never   Patient Determined To Be At Risk for Harm To Self or Others Based on Review of Patient Reported Information or Presenting Complaint? No  Method: No data recorded Availability of Means: No data recorded Intent: No data recorded Notification Required: No data recorded Additional Information for Danger  to Others Potential: No data recorded Additional Comments for Danger to Others Potential: No data recorded Are There Guns or Other Weapons in Your Home? No data recorded Types of Guns/Weapons: No data recorded Are These Weapons Safely Secured?                            No data recorded Who Could Verify You Are Able To Have These Secured: No data recorded Do You Have any Outstanding Charges, Pending Court Dates, Parole/Probation? No data recorded Contacted To Inform of Risk of Harm To Self or Others: Other: Comment (N/A)    Does Patient Present under Involuntary Commitment? Yes  IVC Papers Initial File Date: 02/25/21   [place] of Residence: [place]   Patient Currently Receiving the Following Services: Not Receiving Services   Determination of Need: Urgent (48 hours)   Options For Referral: Medication Management; Outpatient Therapy; Inpatient Hospitalization     CCA Biopsychosocial Patient Reported Schizophrenia/Schizoaffective Diagnosis in Past: No   Strengths: Supportive family   Mental Health Symptoms Depression:   Change in  energy/activity   Duration of Depressive symptoms:  Duration of Depressive Symptoms: Greater than two weeks   Mania:   None   Anxiety:    Worrying; Tension   Psychosis:   Hallucinations   Duration of Psychotic symptoms:  Duration of Psychotic Symptoms: Greater than six months   Trauma:   None   Obsessions:   None   Compulsions:   None   Inattention:   None   Hyperactivity/Impulsivity:   None   Oppositional/Defiant Behaviors:   None   Emotional Irregularity:   Mood lability   Other Mood/Personality Symptoms:   None noted    Mental Status Exam Appearance and self-care  Stature:   Average   Weight:   Average weight   Clothing:   Casual   Grooming:   Normal   Cosmetic use:   None   Posture/gait:   Stooped   Motor activity:   Slowed   Sensorium  Attention:   Normal   Concentration:    Normal   Orientation:   X5   Recall/memory:   Normal   Affect and Mood  Affect:   Blunted   Mood:   Dysphoric   Relating  Eye contact:   Normal   Facial expression:   Responsive   Attitude toward examiner:   Guarded   Thought and Language  Speech flow:  Clear and Coherent   Thought content:   Appropriate to Mood and Circumstances   Preoccupation:   None   Hallucinations:   None   Organization:  No data recorded  [employer] of Knowledge:   Average   Intelligence:   Average   Abstraction:   Functional   Judgement:   Fair   Dance movement psychotherapist:   Adequate   Insight:   Fair   Decision Making:   Impulsive   Social Functioning  Social Maturity:   Isolates   Social Judgement:   Normal   Stress  Stressors:   Armed forces operational officer (Pt reported that he is facing trespassing charges)   Coping Ability:   Normal   Skill Deficits:   None   Supports:   Family     Religion:    Leisure/Recreation: Leisure / Recreation Do You Have Hobbies?: No  Exercise/Diet: Exercise/Diet Do You Exercise?: No Have You Gained or Lost A Significant Amount of Weight in the Past Six Months?: No Do You Follow a Special Diet?: No Do You Have Any Trouble Sleeping?: No   CCA Employment/Education Employment/Work Situation: Employment / Work Situation Employment Situation: Student Has Patient ever Been in Equities trader?: No  Education: Education Is Patient Currently Attending School?: Yes School Currently Attending: [employer] Did You Product manager?: Yes What Type of College Degree Do you Have?: Currently enrolled in  [employer] Did You Have An Individualized Education Program (IIEP): No Did You Have Any Difficulty At School?: No Patient's Education Has Been Impacted by Current Illness: No   CCA Family/Childhood History Family and Relationship History: Family history Marital status: Single Does patient have children?: No  Childhood History:  Childhood  History By whom was/is the patient raised?: Both parents Did patient suffer any verbal/emotional/physical/sexual abuse as a child?: No Did patient suffer from severe childhood neglect?: No Has patient ever been sexually abused/assaulted/raped as an adolescent or adult?: No Was the patient ever a victim of a crime or a disaster?: No Witnessed domestic violence?: No Has patient been affected by domestic violence as an adult?: No  Child/Adolescent Assessment:  CCA Substance Use Alcohol/Drug Use: Alcohol / Drug Use History of alcohol / drug use?: Yes Longest period of sobriety (when/how long): Unknown Withdrawal Symptoms: None Substance #1 Name of Substance 1: Marijuana 1 - Amount (size/oz): up to 2 grams 1 - Frequency: Daily 1 - Duration: Ongoing 1 - Last Use / Amount: 09/01/2021 1 - Method of Aquiring: Street purchase 1- Route of Use: Oral inhalation                       ASAM's:  Six Dimensions of Multidimensional Assessment  Dimension 1:  Acute Intoxication and/or Withdrawal Potential:   Dimension 1:  Description of individual's past and current experiences of substance use and withdrawal: No w/d experiences in the past  Dimension 2:  Biomedical Conditions and Complications:   Dimension 2:  Description of patient's biomedical conditions and  complications: Pt lists no medical concerns  Dimension 3:  Emotional, Behavioral, or Cognitive Conditions and Complications:  Dimension 3:  Description of emotional, behavioral, or cognitive conditions and complications: Pt denied symptoms, but per note, Pt has a mental health history  Dimension 4:  Readiness to Change:     Dimension 5:  Relapse, Continued use, or Continued Problem Potential:  Dimension 5:  Relapse, continued use, or continued problem potential critiera description: Pt has no desire to quit using marijuana at this time.  Dimension 6:  Recovery/Living Environment:  Dimension 6:  Recovery/Iiving environment  criteria description: Pt lives with his parents who are supportive of his mental health.  ASAM Severity Score: ASAM's Severity Rating Score: 8  ASAM Recommended Level of Treatment: ASAM Recommended Level of Treatment: Level I Outpatient Treatment   Substance use Disorder (SUD) Substance Use Disorder (SUD)  Checklist Symptoms of Substance Use: Continued use despite having a persistent/recurrent physical/psychological problem caused/exacerbated by use  Recommendations for Services/Supports/Treatments: Recommendations for Services/Supports/Treatments Recommendations For Services/Supports/Treatments: Individual Therapy, Medication Management  Discharge Disposition:    DSM5 Diagnoses: Patient Active Problem List   Diagnosis Date Noted   Substance-induced psychotic disorder with hallucinations (HCC)    

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
## 2021-09-01 NOTE — ED Notes (Signed)
Pt sleeping at present, no distress noted, monitoring for safety. 

## 2021-09-01 NOTE — ED Notes (Signed)
Pt asked for something to eat, pt was given one of the [employer] chicken dinners from the fridge. Pt also wanted a cup of water which was provided.

## 2021-09-01 NOTE — ED Notes (Signed)
[facility] courier called to collect STAT lab specimens to deliver to [facility].

## 2021-09-01 NOTE — ED Notes (Signed)
Pt awake, alert & responsive, no distress noted, resting at present. Calm & cooperative. Pt is IVC.  Monitoring for safety.  Comfort measures given.

## 2021-09-02 MED ORDER — OLANZAPINE 5 MG PO TBDP: 5.0000 mg | ORAL_TABLET | Freq: Two times a day (BID) | ORAL | Status: DC

## 2021-09-02 MED ORDER — OLANZAPINE 10 MG IM SOLR: 5.0000 mg | Freq: Two times a day (BID) | INTRAMUSCULAR | Status: DC

## 2021-09-02 NOTE — ED Provider Notes (Signed)
Behavioral Health Progress Note  Date and Time: 09/02/2021 11:30 AM Name: Ethan Oconnor MRN:  [id number]  Subjective:  Patient states " I feel good, I am alive."  Patient is assessed face-to-face by nurse practitioner.  He is seated in assessment area, no acute distress.  He is alert and oriented, cooperative during assessment. He presents with anxious mood, labile affect.  He is noted to sit on the counter in an inappropriate way, he does not remove himself from counter when encouraged by staff. He denies suicidal and homicidal ideations.  He denies any history of suicide attempts, denies history of self-harm.  He contracts verbally for safety with this Clinical research associate.  Patient presents with bizarre behavior including walking on hands on unit. He endorses paranoia, he is unable to articulate specifics of paranoid ideations.  He states "I feel a breeze like a solar wind."  He denies both auditory and visual hallucinations however patient appears to be responding to internal stimuli.  He is slow to respond, concern for potential thought blocking.  Patient offered support and encouragement.      Diagnosis:  Final diagnoses:  Drug-induced psychotic disorder with complication (HCC)    Total Time spent with patient: 30 minutes  Past Psychiatric History:  Past Medical History: No past medical history on file.  Past Surgical History:  Procedure Laterality Date   TONSILLECTOMY     Family History:  Family History  Problem Relation Age of Onset   Sudden death Neg Hx    Heart attack Neg Hx    Family Psychiatric  History: none reported Social History:  Social History   Substance and Sexual Activity  Alcohol Use Yes     Social History   Substance and Sexual Activity  Drug Use Yes   Types: Marijuana    Social History   Socioeconomic History   Marital status: Single    Spouse name: Not on file   Number of children: Not on file   Years of education: Not on file   Highest education  level: Not on file  Occupational History   Not on file  Tobacco Use   Smoking status: Never   Smokeless tobacco: Never  Substance and Sexual Activity   Alcohol use: Yes   Drug use: Yes    Types: Marijuana   Sexual activity: Not on file  Other Topics Concern   Not on file  Social History Narrative   Not on file   Social Determinants of Health   Financial Resource Strain: Not on file  Food Insecurity: Not on file  Transportation Needs: Not on file  Physical Activity: Not on file  Stress: Not on file  Social Connections: Not on file   SDOH:  SDOH Screenings   Alcohol Screen: Not on file  Depression (PHQ2-9): Low Risk    PHQ-2 Score: 1  Financial Resource Strain: Not on file  Food Insecurity: Not on file  Housing: Not on file  Physical Activity: Not on file  Social Connections: Not on file  Stress: Not on file  Tobacco Use: Low Risk    Smoking Tobacco Use: Never   Smokeless Tobacco Use: Never  Transportation Needs: Not on file   Additional Social History:    History of alcohol / drug use?: Yes Longest period of sobriety (when/how long): Unknown Withdrawal Symptoms: None Name of Substance 1: Marijuana 1 - Amount (size/oz): up to 2 grams 1 - Frequency: Daily 1 - Duration: Ongoing 1 - Last Use / Amount: 09/01/2021 1 -  Method of Aquiring: Street purchase 1- Route of Use: Oral inhalation                  Sleep: Fair  Appetite:  Fair  Current Medications:  Current Facility-Administered Medications  Medication Dose Route Frequency Provider Last Rate Last Admin   acetaminophen (TYLENOL) tablet 650 mg  650 mg Oral Q6H PRN White, Patrice L, NP       alum & mag hydroxide-simeth (MAALOX/MYLANTA) 200-200-20 MG/5ML suspension 30 mL  30 mL Oral Q4H PRN White, Patrice L, NP       hydrOXYzine (ATARAX/VISTARIL) tablet 25 mg  25 mg Oral TID PRN White, Patrice L, NP       magnesium hydroxide (MILK OF MAGNESIA) suspension 30 mL  30 mL Oral Daily PRN White, Patrice L,  NP       OLANZapine zydis (ZYPREXA) disintegrating tablet 5 mg  5 mg Oral BID Lenard Lance, FNP       Or   OLANZapine (ZYPREXA) injection 5 mg  5 mg Intramuscular BID Lenard Lance, FNP       OLANZapine zydis (ZYPREXA) disintegrating tablet 5 mg  5 mg Oral Q8H PRN White, Patrice L, NP   5 mg at 09/02/21 1033   And   ziprasidone (GEODON) injection 20 mg  20 mg Intramuscular PRN White, Patrice L, NP       Current Outpatient Medications  Medication Sig Dispense Refill   Ascorbic Acid (VITAMIN C PO) Take 1 capsule by mouth daily as needed (For immune health.).      Labs  Lab Results:  Admission on 09/01/2021  Component Date Value Ref Range Status   SARS Coronavirus 2 by RT PCR 09/01/2021 NEGATIVE  NEGATIVE Final   Comment: (NOTE) SARS-CoV-2 target nucleic acids are NOT DETECTED.  The SARS-CoV-2 RNA is generally detectable in upper respiratory specimens during the acute phase of infection. The lowest concentration of SARS-CoV-2 viral copies this assay can detect is 138 copies/mL. A negative result does not preclude SARS-Cov-2 infection and should not be used as the sole basis for treatment or other patient management decisions. A negative result may occur with  improper specimen collection/handling, submission of specimen other than nasopharyngeal swab, presence of viral mutation(s) within the areas targeted by this assay, and inadequate number of viral copies(<138 copies/mL). A negative result must be combined with clinical observations, patient history, and epidemiological information. The expected result is Negative.  Fact Sheet for Patients:  BloggerCourse.com  Fact Sheet for Healthcare Providers:  SeriousBroker.it  This test is no                          t yet approved or cleared by the [place] FDA and  has been authorized for detection and/or diagnosis of SARS-CoV-2 by FDA under an Emergency Use Authorization (EUA).  This EUA will remain  in effect (meaning this test can be used) for the duration of the COVID-19 declaration under Section 564(b)(1) of the Act, 21 U.S.C.section 360bbb-3(b)(1), unless the authorization is terminated  or revoked sooner.       Influenza A by PCR 09/01/2021 NEGATIVE  NEGATIVE Final   Influenza B by PCR 09/01/2021 NEGATIVE  NEGATIVE Final   Comment: (NOTE) The Xpert Xpress SARS-CoV-2/FLU/RSV plus assay is intended as an aid in the diagnosis of influenza from Nasopharyngeal swab specimens and should not be used as a sole basis for treatment. Nasal washings and aspirates are unacceptable for  Xpert Xpress SARS-CoV-2/FLU/RSV testing.  Fact Sheet for Patients: BloggerCourse.com  Fact Sheet for Healthcare Providers: SeriousBroker.it  This test is not yet approved or cleared by the [place] FDA and has been authorized for detection and/or diagnosis of SARS-CoV-2 by FDA under an Emergency Use Authorization (EUA). This EUA will remain in effect (meaning this test can be used) for the duration of the COVID-19 declaration under Section 564(b)(1) of the Act, 21 U.S.C. section 360bbb-3(b)(1), unless the authorization is terminated or revoked.  Performed at [facility], [address] [place], [place] [address]    WBC 09/01/2021 5.9  4.0 - 10.5 K/uL Final   RBC 09/01/2021 4.52  4.22 - 5.81 MIL/uL Final   Hemoglobin 09/01/2021 14.2  13.0 - 17.0 g/dL Final   HCT 20/25/4270 42.5  39.0 - 52.0 % Final   MCV 09/01/2021 94.0  80.0 - 100.0 fL Final   MCH 09/01/2021 31.4  26.0 - 34.0 pg Final   MCHC 09/01/2021 33.4  30.0 - 36.0 g/dL Final   RDW 62/37/6283 12.2  11.5 - 15.5 % Final   Platelets 09/01/2021 149 (A) 150 - 400 K/uL Final   nRBC 09/01/2021 0.0  0.0 - 0.2 % Final   Neutrophils Relative % 09/01/2021 54  % Final   Neutro Abs 09/01/2021 3.1  1.7 - 7.7 K/uL Final   Lymphocytes Relative 09/01/2021 39  % Final    Lymphs Abs 09/01/2021 2.3  0.7 - 4.0 K/uL Final   Monocytes Relative 09/01/2021 7  % Final   Monocytes Absolute 09/01/2021 0.4  0.1 - 1.0 K/uL Final   Eosinophils Relative 09/01/2021 0  % Final   Eosinophils Absolute 09/01/2021 0.0  0.0 - 0.5 K/uL Final   Basophils Relative 09/01/2021 0  % Final   Basophils Absolute 09/01/2021 0.0  0.0 - 0.1 K/uL Final   Immature Granulocytes 09/01/2021 0  % Final   Abs Immature Granulocytes 09/01/2021 0.01  0.00 - 0.07 K/uL Final   Performed at [facility], [address], [place], [place] [address]   Sodium 09/01/2021 141  135 - 145 mmol/L Final   Potassium 09/01/2021 4.6  3.5 - 5.1 mmol/L Final   Chloride 09/01/2021 102  98 - 111 mmol/L Final   CO2 09/01/2021 28  22 - 32 mmol/L Final   Glucose, Bld 09/01/2021 77  70 - 99 mg/dL Final   Glucose reference range applies only to samples taken after fasting for at least 8 hours.   BUN 09/01/2021 15  6 - 20 mg/dL Final   Creatinine, Ser 09/01/2021 1.09  0.61 - 1.24 mg/dL Final   Calcium 16/05/3709 10.1  8.9 - 10.3 mg/dL Final   Total Protein 62/69/4854 7.3  6.5 - 8.1 g/dL Final   Albumin 62/70/3500 4.9  3.5 - 5.0 g/dL Final   AST 93/81/8299 24  15 - 41 U/L Final   ALT 09/01/2021 15  0 - 44 U/L Final   Alkaline Phosphatase 09/01/2021 43  38 - 126 U/L Final   Total Bilirubin 09/01/2021 0.9  0.3 - 1.2 mg/dL Final   GFR, Estimated 09/01/2021 >60  >60 mL/min Final   Comment: (NOTE) Calculated using the CKD-EPI Creatinine Equation (2021)    Anion gap 09/01/2021 11  5 - 15 Final   Performed at [facility], [address], [place], [place] [address]   Hgb A1c MFr Bld 09/01/2021 5.3  4.8 - 5.6 % Final   Comment: (NOTE) Pre diabetes:  5.7%-6.4%  Diabetes:              >6.4%  Glycemic control for   <7.0% adults with diabetes    Mean Plasma Glucose 09/01/2021 105.41  mg/dL Final   Performed at [facility], [address], [place], [place] [address]   Alcohol, Ethyl (B)  09/01/2021 <10  <10 mg/dL Final   Comment: (NOTE) Lowest detectable limit for serum alcohol is 10 mg/dL.  For medical purposes only. Performed at [facility], [address] [place], [place] [address]    Cholesterol 09/01/2021 149  0 - 200 mg/dL Final   Triglycerides 20/80/2233 27  <150 mg/dL Final   HDL 61/22/4497 51  >40 mg/dL Final   Total CHOL/HDL Ratio 09/01/2021 2.9  RATIO Final   VLDL 09/01/2021 5  0 - 40 mg/dL Final   LDL Cholesterol 09/01/2021 93  0 - 99 mg/dL Final   Comment:        Total Cholesterol/HDL:CHD Risk Coronary Heart Disease Risk Table                     Men   Women  1/2 Average Risk   3.4   3.3  Average Risk       5.0   4.4  2 X Average Risk   9.6   7.1  3 X Average Risk  23.4   11.0        Use the calculated Patient Ratio above and the CHD Risk Table to determine the patient's CHD Risk.        ATP III CLASSIFICATION (LDL):  <100     mg/dL   Optimal  530-051  mg/dL   Near or Above                    Optimal  130-159  mg/dL   Borderline  102-111  mg/dL   High  >735     mg/dL   Very High Performed at [facility], [address], [place], [place] [address]    TSH 09/01/2021 1.985  0.350 - 4.500 uIU/mL Final   Comment: Performed by a 3rd Generation assay with a functional sensitivity of <=0.01 uIU/mL. Performed at [facility], [address], [place], [place] [address]    POC Amphetamine UR 09/01/2021 None Detected  NONE DETECTED (Cut Off Level 1000 ng/mL) Final   POC Secobarbital (BAR) 09/01/2021 None Detected  NONE DETECTED (Cut Off Level 300 ng/mL) Final   POC Buprenorphine (BUP) 09/01/2021 None Detected  NONE DETECTED (Cut Off Level 10 ng/mL) Final   POC Oxazepam (BZO) 09/01/2021 None Detected  NONE DETECTED (Cut Off Level 300 ng/mL) Final   POC Cocaine UR 09/01/2021 None Detected  NONE DETECTED (Cut Off Level 300 ng/mL) Final   POC Methamphetamine UR 09/01/2021 None Detected  NONE DETECTED (Cut Off Level 1000 ng/mL) Final    POC Morphine 09/01/2021 None Detected  NONE DETECTED (Cut Off Level 300 ng/mL) Final   POC Oxycodone UR 09/01/2021 None Detected  NONE DETECTED (Cut Off Level 100 ng/mL) Final   POC Methadone UR 09/01/2021 None Detected  NONE DETECTED (Cut Off Level 300 ng/mL) Final   POC Marijuana UR 09/01/2021 Positive (A) NONE DETECTED (Cut Off Level 50 ng/mL) Final   SARS Coronavirus 2 Ag 09/01/2021 Negative  Negative Final   SARSCOV2ONAVIRUS 2 AG 09/01/2021 NEGATIVE  NEGATIVE Final   Comment: (NOTE) SARS-CoV-2 antigen NOT DETECTED.   Negative  results are presumptive.  Negative results do not preclude SARS-CoV-2 infection and should not be used as the sole basis for treatment or other patient management decisions, including infection  control decisions, particularly in the presence of clinical signs and  symptoms consistent with COVID-19, or in those who have been in contact with the virus.  Negative results must be combined with clinical observations, patient history, and epidemiological information. The expected result is Negative.  Fact Sheet for Patients: https://www.jennings-kim.com/  Fact Sheet for Healthcare Providers: https://alexander-rogers.biz/  This test is not yet approved or cleared by the [place] FDA and  has been authorized for detection and/or diagnosis of SARS-CoV-2 by FDA under an Emergency Use Authorization (EUA).  This EUA will remain in effect (meaning this test can be used) for the duration of  the COV                          ID-19 declaration under Section 564(b)(1) of the Act, 21 U.S.C. section 360bbb-3(b)(1), unless the authorization is terminated or revoked sooner.      Blood Alcohol level:  Lab Results  Component Value Date   ETH <10 09/01/2021   ETH <10 02/25/2021    Metabolic Disorder Labs: Lab Results  Component Value Date   HGBA1C 5.3 09/01/2021   MPG 105.41 09/01/2021   No results found for: PROLACTIN Lab Results   Component Value Date   CHOL 149 09/01/2021   TRIG 27 09/01/2021   HDL 51 09/01/2021   CHOLHDL 2.9 09/01/2021   VLDL 5 09/01/2021   LDLCALC 93 09/01/2021   LDLCALC 103 (H) 02/26/2021    Therapeutic Lab Levels: No results found for: LITHIUM No results found for: VALPROATE No components found for:  CBMZ  Physical Findings   PHQ2-9    Flowsheet Row ED from 09/01/2021 in [facility] ED from 02/25/2021 in [facility]  PHQ-2 Total Score 1 0  PHQ-9 Total Score -- 1      Flowsheet Row ED from 09/01/2021 in [facility] ED from 02/26/2021 in [facility] ED from 02/25/2021 in [facility]  C-SSRS RISK CATEGORY No Risk No Risk No Risk        Musculoskeletal  Strength & Muscle Tone: within normal limits Gait & Station: normal Patient leans: N/A  Psychiatric Specialty Exam  Presentation  General Appearance: Casual  Eye Contact:Fair  Speech:Slow  Speech Volume:Decreased  Handedness:Right   Mood and Affect  Mood:Labile  Affect:Labile   Thought Process  Thought Processes:Disorganized  Descriptions of Associations:Tangential  Orientation:Full (Time, Place and Person)  Thought Content:Tangential  Diagnosis of Schizophrenia or Schizoaffective disorder in past: No  Duration of Psychotic Symptoms: Greater than six months   Hallucinations:Hallucinations: None  Ideas of Reference:Paranoia  Suicidal Thoughts:Suicidal Thoughts: No  Homicidal Thoughts:Homicidal Thoughts: No   Sensorium  Memory:Immediate Fair; Recent Fair; Remote Fair  Judgment:Impaired  Insight:Lacking   Executive Functions  Concentration:Fair  Attention Span:Fair  Recall:Fair  Fund of Knowledge:Fair  Language:Fair   Psychomotor Activity  Psychomotor Activity:Psychomotor Activity: Restlessness   Assets  Assets:Communication Skills;  Desire for Improvement; Financial Resources/Insurance; Housing; Intimacy; Social Support; Resilience; Physical Health   Sleep  Sleep:Sleep: Fair Number of Hours of Sleep: 6   Nutritional Assessment (For OBS and FBC admissions only) Has the patient had a weight loss or gain of 10 pounds or more in the last 3 months?: No  Has the patient had a decrease in food intake/or appetite?: No Does the patient have dental problems?: No Does the patient have eating habits or behaviors that may be indicators of an eating disorder including binging or inducing vomiting?: No Has the patient recently lost weight without trying?: 0 Has the patient been eating poorly because of a decreased appetite?: 0 Malnutrition Screening Tool Score: 0    Physical Exam  Physical Exam Vitals and nursing note reviewed.  Constitutional:      Appearance: Normal appearance. He is well-developed and normal weight.  HENT:     Head: Normocephalic and atraumatic.     Nose: Nose normal.  Cardiovascular:     Rate and Rhythm: Normal rate.  Pulmonary:     Effort: Pulmonary effort is normal.  Musculoskeletal:        General: Normal range of motion.     Cervical back: Normal range of motion.  Skin:    General: Skin is warm and dry.  Neurological:     Mental Status: He is alert and oriented to person, place, and time.  Psychiatric:        Mood and Affect: Mood is anxious. Affect is labile.        Speech: Speech is tangential.        Behavior: Behavior is uncooperative.        Thought Content: Thought content is paranoid and delusional.   Review of Systems  Constitutional: Negative.   HENT: Negative.    Eyes: Negative.   Respiratory: Negative.    Cardiovascular: Negative.   Gastrointestinal: Negative.   Genitourinary: Negative.   Musculoskeletal: Negative.   Skin: Negative.   Neurological: Negative.   Endo/Heme/Allergies: Negative.   Psychiatric/Behavioral:  The patient is nervous/anxious.   Blood pressure  105/74, pulse 61, temperature 98.1 F (36.7 C), temperature source Oral, resp. rate 18, SpO2 100 %. There is no height or weight on file to calculate BMI.  Treatment Plan Summary: Daily contact with patient to assess and evaluate symptoms and progress in treatment and Medication management Patient remains under involuntary commitment petition. Patient reviewed with Dr. Bronwen Betters. He would benefit from inpatient psychiatric hospitalization. He has been accepted for inpatient psychiatric treatment at [facility], Dr. Phillis Haggis.  He will be transported by police later today.  Lenard Lance, FNP 09/02/2021 11:30 AM

## 2021-09-02 NOTE — ED Notes (Signed)
Pt sleeping at present, no distress noted.  Monitoring for safety. 

## 2021-09-02 NOTE — ED Notes (Signed)
Pt jumped onto the counter with the water fountain. Staff redirected and Pt removed himself from the counter.

## 2021-09-02 NOTE — Progress Notes (Signed)
Pt's IVC paperwork faxed to [phone] per Lillia Abed Striblin's , LCSW order.

## 2021-09-02 NOTE — Progress Notes (Signed)
Pt is flipping on the unit. Needs constant redirection. Able to redirect at this time. Monitoring for pt's safety.

## 2021-09-02 NOTE — Discharge Summary (Signed)
Darel Hong to be D/C'd to [facility]  per FNP order. Discussed with the patient and all questions fully answered. An After Visit Summary, EMTALA and Med Necessity forms were printed and to be given to the receiving nurse. Patient escorted out and transported via [employer]. Dickie La  09/02/2021 1:56 PM

## 2021-09-02 NOTE — ED Notes (Signed)
Pt flipping on the unit told him to stop he continues

## 2021-09-02 NOTE — Progress Notes (Signed)
Patient has been denied by [facility] due to a lack of appropriate beds. Patient meets BH inpatient criteria per Doran Heater, FNP. Patient has been faxed out to the following facilities:   [facility]  [address], [place] [address] [phone]  [facility]  [address] [place] [address] [phone]  [facility]  [address], [place] [address] [phone]  [facility]  [address] [place] [address] [phone]  [facility]  [address] [place], [place] [address] [phone]  [facility]  [address] [place], [place] [address] [phone]  [facility]  [address] [place] [place] [address] [phone]  [facility]  [address] [place] [address] [phone]  [facility]  [address], [place] [address] [phone]  [facility]  [address] [place] [address] [phone]    Damita Dunnings, MSW, LCSW-A  10:51 AM 09/02/2021

## 2021-09-02 NOTE — Progress Notes (Signed)
Report called to [employer], RN, [facility]. Sheriff dept called for transport.

## 2021-09-02 NOTE — Progress Notes (Signed)
Pt accepted to [facility]     Patient meets inpatient criteria per Doran Heater, FNP  The attending provider will be Dr. Riley Lam  Call report to [phone]  Dickie La, RN @ [facility] notified.     Pt scheduled  to arrive at [facility] TODAY ANYTIME. IVC paperwork needs to be fax to [phone] prior to transporting the Pt to the facility.    Damita Dunnings, MSW, LCSW-A  11:54 AM 09/02/2021
